# Patient Record
Sex: Female | Born: 1940 | Race: White | Hispanic: No | Smoking: Never smoker
Health system: Southern US, Community
[De-identification: ages and names within clinical notes are randomized; demographics above are authoritative.]

## PROBLEM LIST (undated history)

## (undated) DIAGNOSIS — E782 Mixed hyperlipidemia: Secondary | ICD-10-CM

## (undated) DIAGNOSIS — E119 Type 2 diabetes mellitus without complications: Secondary | ICD-10-CM

## (undated) DIAGNOSIS — J45909 Unspecified asthma, uncomplicated: Secondary | ICD-10-CM

## (undated) DIAGNOSIS — I6529 Occlusion and stenosis of unspecified carotid artery: Secondary | ICD-10-CM

## (undated) DIAGNOSIS — I2510 Atherosclerotic heart disease of native coronary artery without angina pectoris: Secondary | ICD-10-CM

## (undated) DIAGNOSIS — Z9119 Patient's noncompliance with other medical treatment and regimen: Secondary | ICD-10-CM

## (undated) DIAGNOSIS — Z91199 Patient's noncompliance with other medical treatment and regimen due to unspecified reason: Secondary | ICD-10-CM

## (undated) DIAGNOSIS — I158 Other secondary hypertension: Secondary | ICD-10-CM

## (undated) HISTORY — DX: Unspecified asthma, uncomplicated: J45.909

## (undated) HISTORY — DX: Patient's noncompliance with other medical treatment and regimen due to unspecified reason: Z91.199

## (undated) HISTORY — DX: Mixed hyperlipidemia: E78.2

## (undated) HISTORY — DX: Occlusion and stenosis of unspecified carotid artery: I65.29

## (undated) HISTORY — DX: Other secondary hypertension: I15.8

## (undated) HISTORY — DX: Atherosclerotic heart disease of native coronary artery without angina pectoris: I25.10

## (undated) HISTORY — DX: Type 2 diabetes mellitus without complications: E11.9

## (undated) HISTORY — DX: Patient's noncompliance with other medical treatment and regimen: Z91.19

---

## 2003-03-31 ENCOUNTER — Emergency Department (HOSPITAL_COMMUNITY): Admission: EM | Admit: 2003-03-31 | Discharge: 2003-03-31 | Payer: Self-pay | Admitting: Emergency Medicine

## 2006-08-25 ENCOUNTER — Ambulatory Visit: Payer: Self-pay | Admitting: Cardiology

## 2007-09-15 ENCOUNTER — Ambulatory Visit: Payer: Self-pay | Admitting: Family Medicine

## 2007-09-29 ENCOUNTER — Inpatient Hospital Stay (HOSPITAL_COMMUNITY): Admission: EM | Admit: 2007-09-29 | Discharge: 2007-10-01 | Payer: Self-pay | Admitting: Emergency Medicine

## 2007-09-29 ENCOUNTER — Ambulatory Visit: Payer: Self-pay | Admitting: Cardiology

## 2007-10-19 ENCOUNTER — Ambulatory Visit: Payer: Self-pay

## 2007-10-26 ENCOUNTER — Ambulatory Visit: Payer: Self-pay | Admitting: Internal Medicine

## 2008-03-15 ENCOUNTER — Ambulatory Visit: Payer: Self-pay | Admitting: Internal Medicine

## 2008-03-20 ENCOUNTER — Ambulatory Visit: Payer: Self-pay | Admitting: Vascular Surgery

## 2008-08-31 ENCOUNTER — Inpatient Hospital Stay: Payer: Medicare Other | Admitting: Internal Medicine

## 2008-08-31 ENCOUNTER — Ambulatory Visit: Payer: Self-pay | Admitting: Cardiology

## 2008-09-04 ENCOUNTER — Encounter: Payer: Self-pay | Admitting: Internal Medicine

## 2008-09-06 ENCOUNTER — Encounter: Payer: Self-pay | Admitting: Internal Medicine

## 2008-09-21 ENCOUNTER — Ambulatory Visit: Payer: Self-pay | Admitting: Internal Medicine

## 2008-09-21 DIAGNOSIS — E783 Hyperchylomicronemia: Secondary | ICD-10-CM

## 2008-09-21 DIAGNOSIS — I359 Nonrheumatic aortic valve disorder, unspecified: Secondary | ICD-10-CM | POA: Insufficient documentation

## 2008-09-21 DIAGNOSIS — Z91199 Patient's noncompliance with other medical treatment and regimen due to unspecified reason: Secondary | ICD-10-CM | POA: Insufficient documentation

## 2008-09-21 DIAGNOSIS — I6529 Occlusion and stenosis of unspecified carotid artery: Secondary | ICD-10-CM

## 2008-09-21 DIAGNOSIS — I2510 Atherosclerotic heart disease of native coronary artery without angina pectoris: Secondary | ICD-10-CM

## 2008-09-21 DIAGNOSIS — E785 Hyperlipidemia, unspecified: Secondary | ICD-10-CM

## 2008-09-21 DIAGNOSIS — I10 Essential (primary) hypertension: Secondary | ICD-10-CM | POA: Insufficient documentation

## 2008-09-21 DIAGNOSIS — Z9119 Patient's noncompliance with other medical treatment and regimen: Secondary | ICD-10-CM

## 2008-09-24 ENCOUNTER — Inpatient Hospital Stay: Payer: Medicare Other | Admitting: Vascular Surgery

## 2009-01-20 ENCOUNTER — Emergency Department: Payer: Medicare Other | Admitting: Emergency Medicine

## 2009-06-11 ENCOUNTER — Ambulatory Visit: Payer: Medicare Other | Admitting: Ophthalmology

## 2009-06-12 ENCOUNTER — Ambulatory Visit: Payer: Self-pay | Admitting: Internal Medicine

## 2009-07-02 ENCOUNTER — Ambulatory Visit: Payer: Medicare Other | Admitting: Ophthalmology

## 2009-09-12 ENCOUNTER — Ambulatory Visit: Payer: Medicare Other | Admitting: Internal Medicine

## 2009-09-24 ENCOUNTER — Ambulatory Visit: Payer: Medicare Other | Admitting: Ophthalmology

## 2009-09-26 ENCOUNTER — Ambulatory Visit: Payer: Medicare Other | Admitting: Internal Medicine

## 2009-10-10 ENCOUNTER — Ambulatory Visit: Payer: Medicare Other | Admitting: Internal Medicine

## 2009-10-23 ENCOUNTER — Ambulatory Visit: Payer: Medicare Other | Admitting: Internal Medicine

## 2009-11-14 ENCOUNTER — Ambulatory Visit: Payer: Medicare Other | Admitting: Internal Medicine

## 2010-01-30 ENCOUNTER — Ambulatory Visit: Payer: Medicare Other

## 2010-03-22 ENCOUNTER — Encounter: Payer: Self-pay | Admitting: Endocrinology

## 2010-06-01 ENCOUNTER — Encounter: Payer: Medicare Other | Admitting: Cardiothoracic Surgery

## 2010-06-01 ENCOUNTER — Encounter: Payer: Medicare Other | Admitting: Nurse Practitioner

## 2010-06-08 IMAGING — XA IR VASCULAR PROCEDURE
14 of 19 series · 15 of 24 positions shown · IV contrast (IODINE)
Comparison: none

[Series 1: arch · 3 acquisitions, 1 frame shown]
[im 1/3]
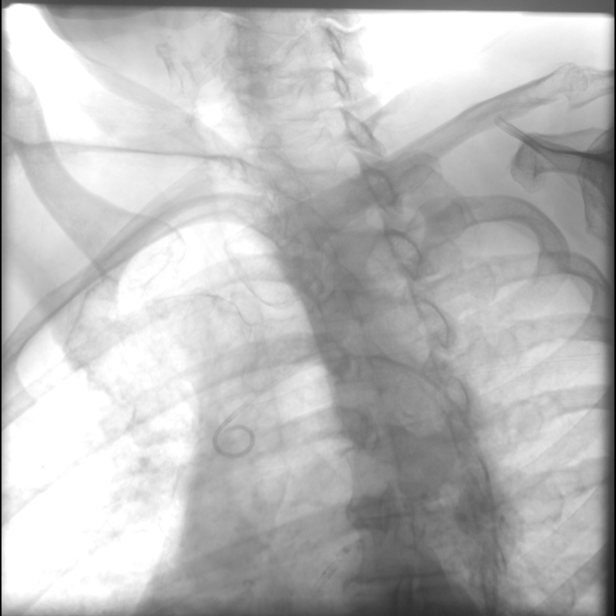

[Series 2: carotid neck · 3 acquisitions, 1 frame shown (1 of 13)]
[im 1/3]
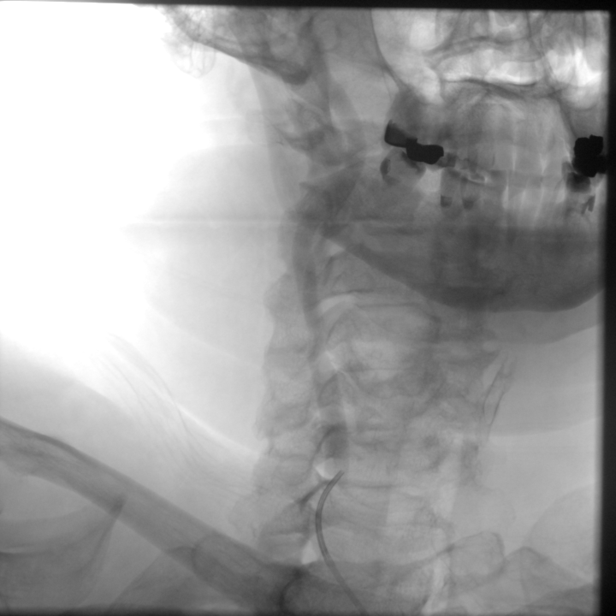

[Series 4: carotid neck · 3 acquisitions, 2 frames shown (2 of 13)]
[im 1/3]
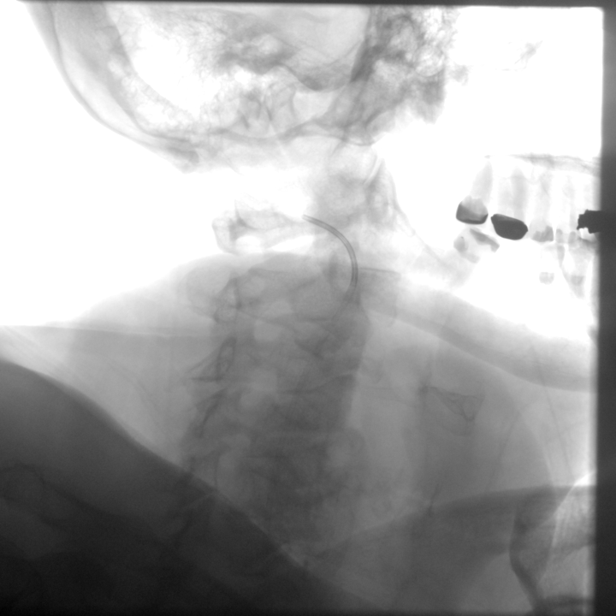
[im 1/3]
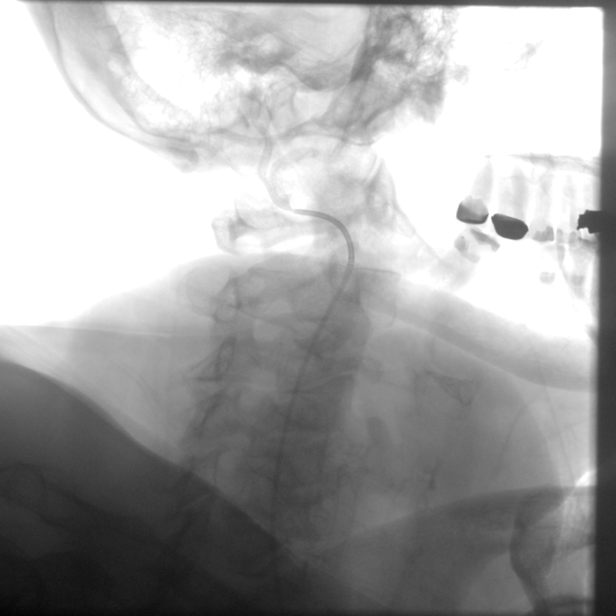

[Series 6: carotid neck · 3 acquisitions, 1 frame shown (3 of 13)]
[im 1/3]
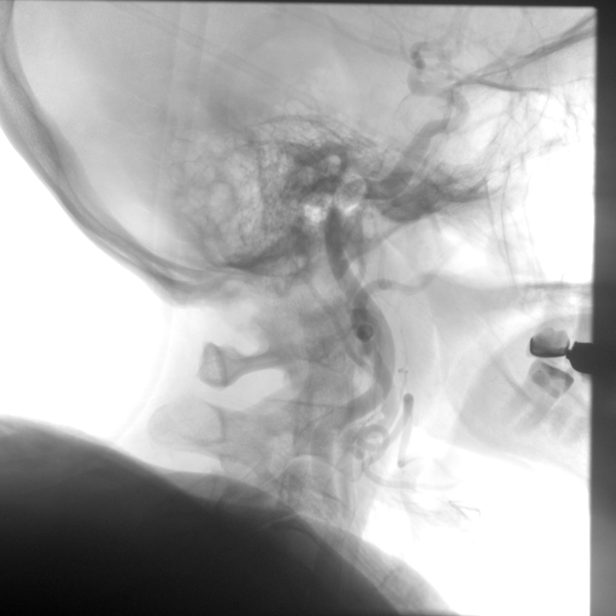

[Series 7: carotid neck · 5 acquisitions, 1 frame shown (4 of 13)]
[im 1/5]
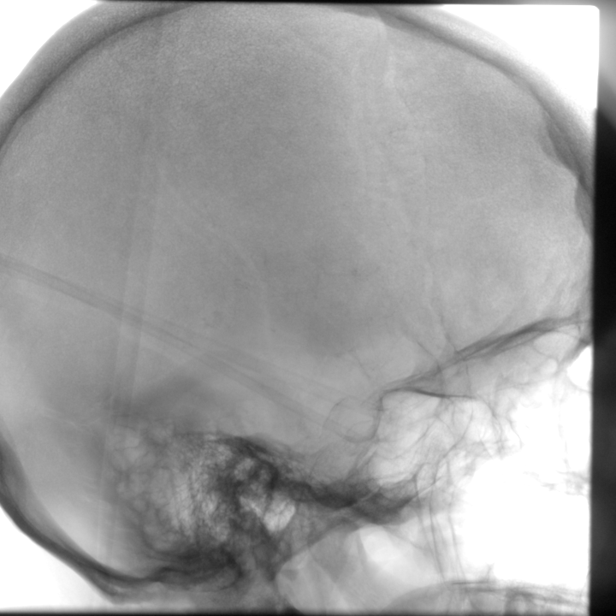

[Series 8: carotid neck · 1 of 5 slices shown (5 of 13)]
[im 5/5]
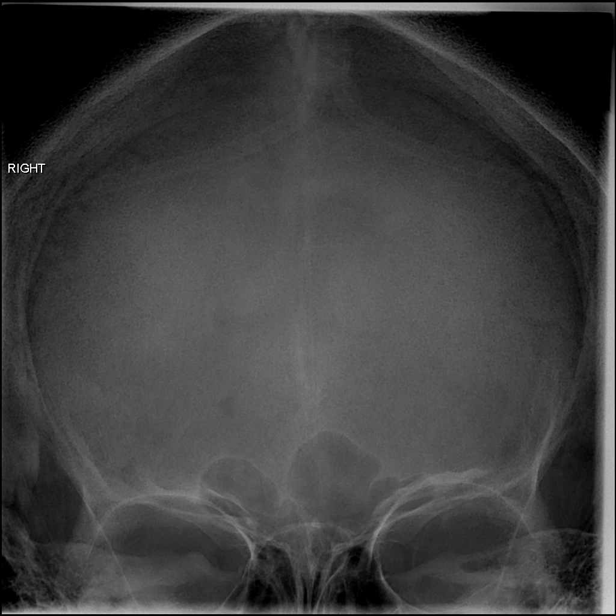

[Series 10: carotid neck · 1 of 27 frames shown (6 of 13)]
[frame 19/27]
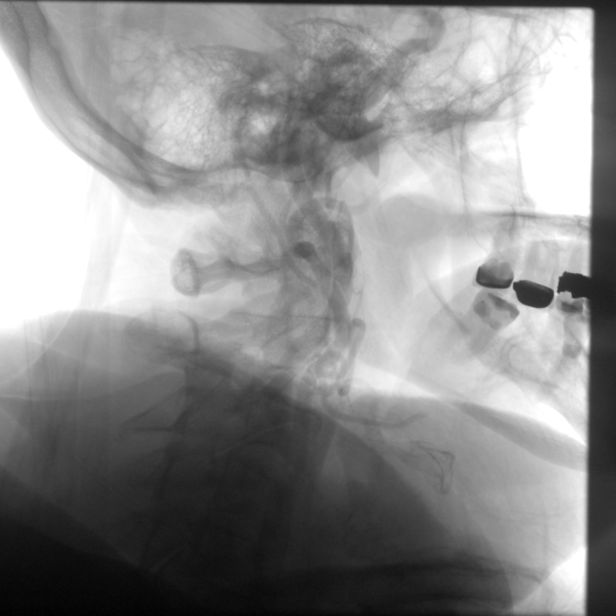

[Series 11: carotid neck · 3 acquisitions, 1 frame shown (7 of 13)]
[im 1/3]
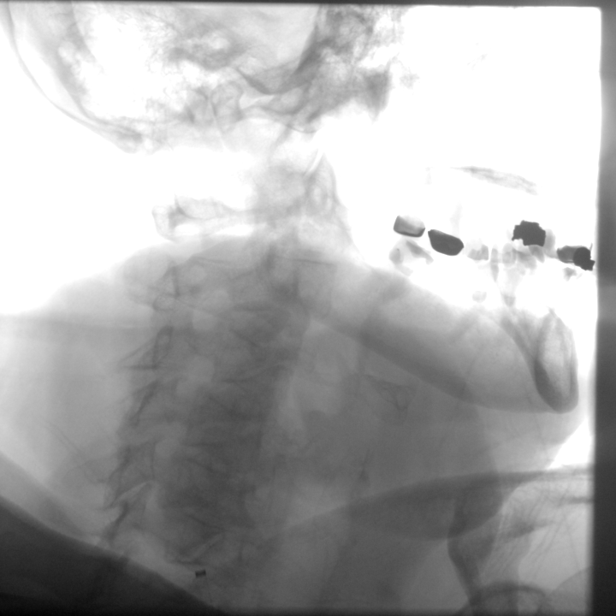

[Series 13: carotid neck · 3 acquisitions, 1 frame shown (8 of 13)]
[im 1/3]
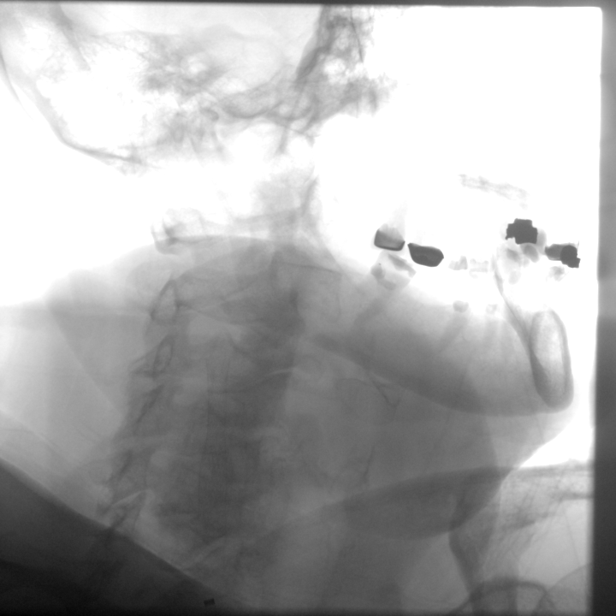

[Series 14: carotid neck · 3 acquisitions, 1 frame shown (9 of 13)]
[im 1/3]
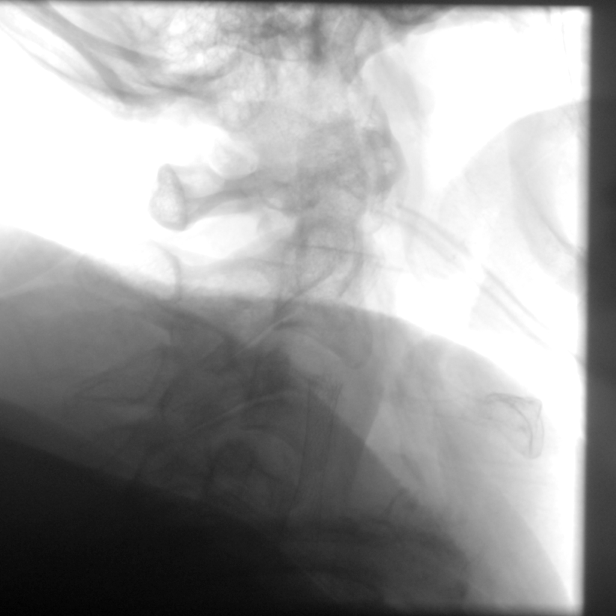

[Series 15: carotid neck · 3 acquisitions, 1 frame shown (10 of 13)]
[im 1/3]
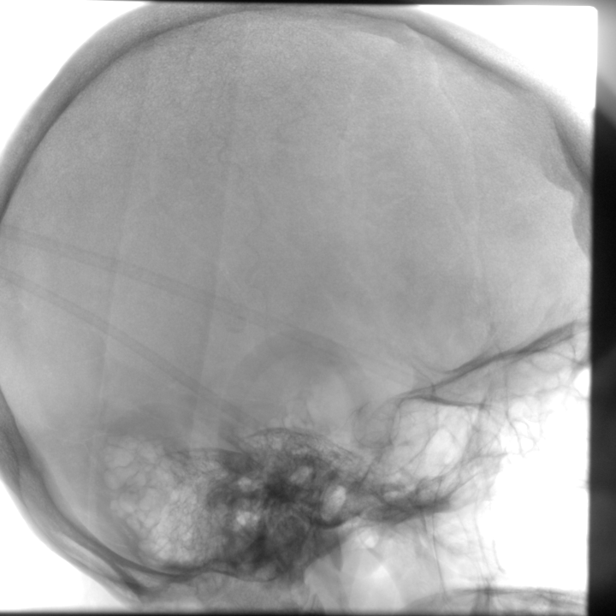

[Series 17: carotid neck · 3 acquisitions, 1 frame shown (11 of 13)]
[im 1/3]
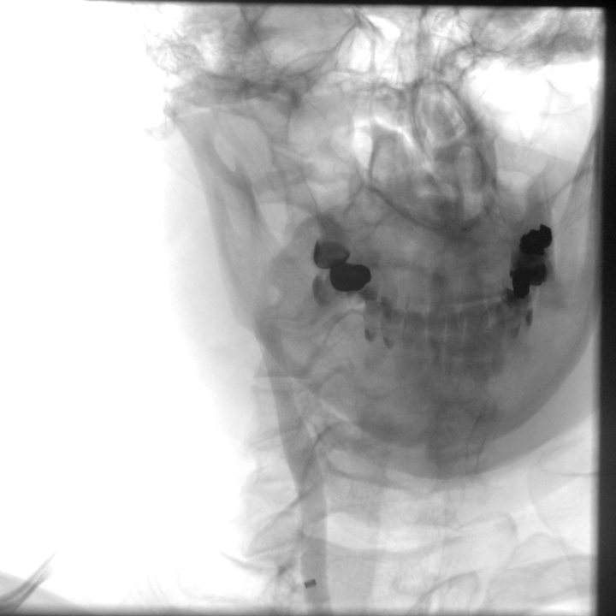

[Series 18: carotid neck · 3 acquisitions, 1 frame shown (12 of 13)]
[im 1/3]
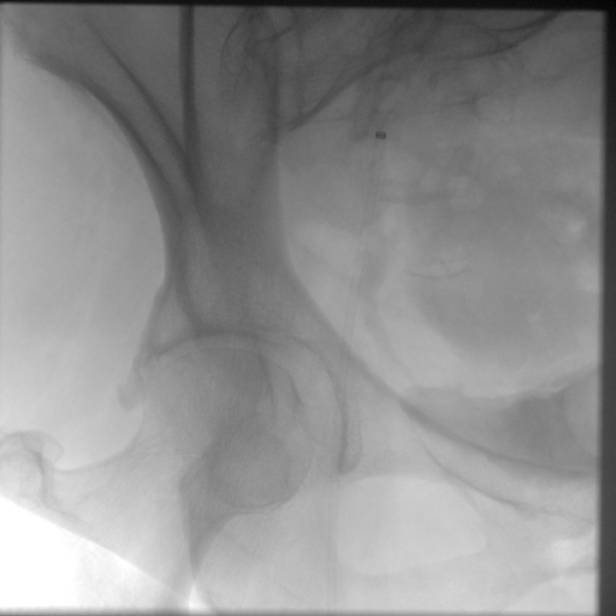

[Series 19: carotid neck · 1 of 29 frames shown (13 of 13)]
[frame 25/29]
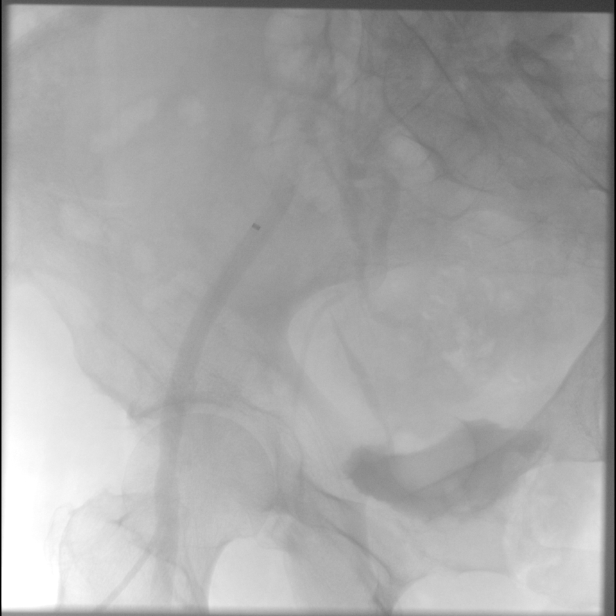

[15 of 24 positions shown; findings below may reference images not displayed]

IMAGES IMPORTED FROM THE SYNGO WORKFLOW SYSTEM
NO DICTATION FOR STUDY

## 2010-07-15 NOTE — Assessment & Plan Note (Signed)
[facility] OFFICE NOTE   Laurie [name], Laurie [name] Laurie [name]                     MRN:          [id number]  DATE:10/26/2007                            DOB:          [birth year]    INTERVAL HISTORY:  Ms. Laurie [name] is a very pleasant 70 year old woman  with a history of diabetes, hyperlipidemia, hypertension, carotid artery  disease, and coronary artery disease.  She is status post a remote LAD  stent in [facility].  She was recently admitted with a non-ST-elevation  myocardial infarction.  She had a Taxus drug-eluting stent to the  diagonal and a Promus drug-eluting stent to the LAD.  She did well.  LV  function was normal.  She returns today for followup.   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  She has not had any focal  neurologic symptoms.  She says her chest pain is resolved.  Her  breathing is better.  She still feels a little bit weak on her legs and  walks with her cane and a walker.  She is about to go to cardiac rehab.   MEDICATIONS:  1. Synthroid 150 a day.  2. Plavix 75 a day.  3. Atenolol 100 a day.  4. Humalog 25 units t.i.d.  5. Lantus 55 units nightly.  6. Imdur 30.  7. Vitamin C.  8. Excedrin Migraine, which has aspirin in it.  9. Lipitor 10.  10.Accupril, which she is out of.   DRUG INTOLERANCES:  MULTIPLE STATINS with rhabdomyolysis.  She is unable  to tolerate higher doses.   PHYSICAL EXAMINATION:  GENERAL:  She is in no acute distress.  Ambulates  around the clinic slowly with her cane.  No respiratory distress.  VITAL SIGNS:  Blood pressure is 124/58, heart rate 76, and weight is  251.  HEENT:  Normal except for the scar from removal of a recent skin lesion  on her forehead.  NECK:  Supple.  No JVD.  Carotids are 1+ bilaterally with bilateral  bruits.   There is no lymphadenopathy or thyromegaly.  CARDIAC:  Regular rate and rhythm.  No murmurs, rubs, or gallops.  LUNGS:  Clear.  ABDOMEN:  Obese, nontender, and nondistended.  No hepatosplenomegaly.  No bruits.  No masses.  Good bowel sounds.  EXTREMITIES:  Warm with no cyanosis or clubbing.  Trace lower extremity  edema.  NEURO:  Alert and oriented x3.  Cranial nerves II through XII are  intact.  Moves all four extremities without difficulty.  Affect is very  pleasant.   ASSESSMENT/PLAN:  1. Coronary artery disease.  She is status post two-vessel      intervention.  She is doing well.  I have recommended cardiac      rehab.  She plans on going.  2. Hypertension, well controlled.  3. Hyperlipidemia.  She can only tolerate low-dose statin.  We will      keep her on  this.  We will recheck her CMET and lipids at next      visit.  Consider possible Zetia.  4. Carotid artery disease.  We will have to refer her to Dr. Liliane Bade for a possible carotid endarterectomy or stenting.   DISPOSITION:  We will see her back in several months for a routine  followup.     [name], MD  Electronically Signed    DRB/MedQ  DD: 10/26/2007  DT: 10/27/2007  Job #: [id number]

## 2010-07-15 NOTE — Cardiovascular Report (Signed)
Laurie Laurie [name], Laurie [name] NO.:  [id number]   MEDICAL RECORD NO.:  [id number]          PATIENT TYPE:  INP   LOCATION:  2916                         FACILITY:  [facility]   PHYSICIAN:  Marca Ancona, MD      DATE OF BIRTH:  04-May-1940   DATE OF PROCEDURE:  DATE OF DISCHARGE:                            CARDIAC CATHETERIZATION   This case was done with Dr. Charlies Constable as the proctor.   PROCEDURE:  Left heart catheterization and left ventriculography.   INDICATION:  Non-ST elevation MI.   PROCEDURE:  After informed consent was obtained, the patient was  sterilely prepped and draped.  The right groin site was locally  anesthetized with 1% lidocaine.  The right common femoral artery was  accessed using Seldinger technique, and a 5-French arterial sheath was  placed.  The left coronary artery was engaged with the 5-French JL4  catheter.  The right coronary artery was engaged with the 5-French JR4  catheter, and the ventricle was entered with the 5-French angled pigtail  catheter.  There were no complications.   FINDINGS:  Left ventriculography:  Overall ejection fraction was 45%.  The mid-to-apical anterolateral wall was severely hypokinetic.  LVEDP  was 14.   Coronary angiography:  The coronary system was right dominant.  The RCA  showed only mild luminal irregularities.  The left main showed no  significant disease.  The LAD had a patent mid LAD stent.  Just distal  to the stent, there was a 60-70% mid LAD stenosis.  Additionally, there  was a large first diagonal vessel with a 99% stenosis.  A small ramus  was totally occluded, and in the circumflex system, there was 50%  stenosis of the ostial first obtuse marginal.   ASSESSMENT:  Tight 99% stenosis in the large first diagonal, 60-70% mid  left anterior descending stenosis distal to the prior placed stent, and  totally occluded ramus.  This was actually a relatively small vessel.   PLAN:  PCI to the first diagonal  and reassessment of the LAD lesion with  possible PCI of the LAD lesion distal to the stent as well.   cc Charlies Constable, MD      Marca Ancona, MD  Electronically Signed     DM/MEDQ  D:  09/29/2007  T:  09/30/2007  Job:  [id number]

## 2010-07-15 NOTE — H&P (Signed)
NAMELACEY, WALLMAN NO.:  [id number]   MEDICAL RECORD NO.:  [id number]         PATIENT TYPE:  CINP   LOCATION:                               FACILITY:  [facility]   PHYSICIAN:  Rollene Rotunda, MD, FACCDATE OF BIRTH:  Jul 29, 1940   DATE OF ADMISSION:  09/29/2007  DATE OF DISCHARGE:                              HISTORY & PHYSICAL   PRIMARY:  Dr. Maryruth Hancock.   REASON FOR PRESENTATION:  The patient with chest pain.   HISTORY OF PRESENT ILLNESS:  This is the second office visit for this  patient.  I first met her in June 2005.  At that time she had chest  discomfort.  She had previous coronary disease as described below.  Because of her symptoms and history, it was suggested and set up for her  to have a cardiac catheterization; however, she never proceeded with  this.  She has a history of medical noncompliance.  I have not seen her  since that appointment as we were unable to contact her or get her to  comply with followup.  Said she has had chest pain off and on since that  time.  However, Sunday of this week she had severe episode while taking  out the garbage.  She said she had chest heaviness, some a 10/10  radiated to her jaw.  There was nausea and vomiting.  She was  diaphoretic and short of breath.  She has had chest discomfort with  minimal exertion since then.  She took 3 sublingual nitroglycerins on  Sunday before the pain finally went away.  She has been taking 1-2  nitroglycerins every day since then.  She did not go to the emergency  room.  She finally presented to the [facility].  It was  suggested that she go to the emergency room but she refused.  She did  comply with coming here today.  She was not describing PND or orthopnea.  She has not described palpitation, presyncope or syncope.  She says she  has been taking a blood pressure medicines and controlling her  cholesterol and taking her blood pressure medicines in controlling her  diabetes.   PAST MEDICAL HISTORY:  1. Coronary artery disease (The patient had a past history of an      abnormal stress test in 2004.  She was found to have a high-grade      LAD lesion and subsequent Taxus stenting.)  2. She has had diabetes mellitus since 1992.  3. Diabetic nephropathy (creatinine is only 0.99 with a GFR of 56).  4. Hypertriglyceridemia.  5. Hypertension.  6. Carotid artery stenosis (I do not know the details).  7. Hypothyroidism.  8. Depression.  9. Umbilical hernia.  10.Bladder prolapse.   PAST SURGICAL HISTORY:  1. C-section.  2. Bilateral tubal ligation.  3. Cholecystectomy.   ALLERGIES:  None.   MEDICATIONS:  1. Levothyroxine 150 mcg daily.  2. Plavix 75 mg daily.  3. Atenolol mg daily.  4. Humalog 25 units t.i.d.  5. Lantus 55 units nightly.  6. Isosorbide 30 mg daily.  7.  Vitamin C.  8. Multivitamin.  9. Excedrin p.r.n.   SOCIAL HISTORY:  The patient lives with her daughter.  She has some  grandchildren.  She has 7 children.  She never smoked cigarettes.  She  does not drink alcohol.   FAMILY HISTORY:  Is contributory for a sister with early onset coronary  disease died at 37.   REVIEW OF SYSTEMS:  As stated in HPI and otherwise negative for other  systems.   PHYSICAL EXAMINATION:  The patient is in no acute distress though she is  burping quite a bit.  She is not actively having chest pain.  Blood pressure 128/68.  Heart rate 75 and regular.  Weight 253 pounds.  HEENT:  Eyes unremarkable, pupils equal, round and reactive to light.  Fundi not visualized.  Oral mucosa unremarkable.  Neck:  No jugular distention, 45 degrees and carotid upstroke brisk and  symmetric.  Left carotid bruit, no thyromegaly.  LYMPHATICS:  No  cervical, axillary, inguinal adenopathy.  LUNGS:  Clear to auscultation without wheezing or crackles.  BACK:  No costovertebral mass.  CHEST:  Unremarkable.  HEART:  PMI not displaced or sustained, S1, S2 within normal.   There is  no S3, S4.  No clicks, rubs, no murmurs.  ABDOMEN:  Morbidly obese.  Positive bowel sounds, normal in frequency and pitch.  No bruits,  rebound, guarding, no midline pulsatile mass.  No hepatomegaly.  Slight  mild voluntary midline tenderness.  SKIN:  No rashes, no nodules.  EXTREMITIES:  Two plus upper pulses, two plus femorals.  Abs, dorsalis  pedis and post tibialis, no edema, cyanosis or clubbing.  NEURO:  Oriented to person, place and time.  Cranial nerves grossly  intact, motor grossly intact.   EKG:  Sinus rhythm, left axis deviation, left ventricle hypertrophy,  interventricular conduction delay, QT slightly prolonged, nonspecific  lateral T wave changes.   ASSESSMENT/PLAN:  1. Chest pain.  The patient is having pain consistent with unstable      angina.  She has known coronary disease.  At this point she needs      cardiac catheterization.  I described to her the risks and benefits      including stroke, death, heart attack, dye allergy, renal      insufficiency, vascular trauma, bleeding, bruising and infection.      She understands and agrees to proceed.  I am going to have her sent      by ambulance to [facility] as I think her symptoms are that acute,      though she is not having an ST-segment elevation myocardial      infarction at this point.  She will have catheterization today      pending labs.  2. Hypertriglyceridemia.  We will check this fasting in the morning      and treat as needed.  3. Diabetes.  She will need her medications excluding metformin which      was discontinued some time recently.      She will need sliding scale insulin.  4. Hypothyroidism.  Check TSH.  Continue levothyroxine.  5. Obesity.  We will discuss this and educate.      Rollene Rotunda, MD, [facility]  Electronically Signed     JH/MEDQ  D:  09/29/2007  T:  09/29/2007  Job:  [id number]

## 2010-07-15 NOTE — Cardiovascular Report (Signed)
Laurie [name], Laurie [name]              ACCOUNT NO.:  [id number]   MEDICAL RECORD NO.:  [id number]          PATIENT TYPE:  INP   LOCATION:  2916                         FACILITY:  [facility]   PHYSICIAN:  Everardo Beals. Juanda Chance, MD, FACCDATE OF BIRTH:  [birth year]   DATE OF PROCEDURE:  09/29/2007  DATE OF DISCHARGE:                            CARDIAC CATHETERIZATION   CLINICAL HISTORY:  Ms. Mcarthy (cox) is 70 years old and has had a  previous stent placed in the mid LAD in [place] several years ago.  She had been seen by Dr. Antoine Poche a year ago for a cardiology followup.  Recently, she developed symptoms of chest pain and came to the emergency  room with more prolonged chest pain and was brought to the cath lab for  evaluation.  The diagnostic cath was performed by Dr. Shirlee Latch with myself  proctoring.  This showed a tight lesion in the diagonal branch of the  LAD and a moderately tight lesion in the mid LAD after the previous  stent.  There was also total occlusion of the ramus branch.  We elected  to proceed on the diagonal and the LAD lesions.   PROCEDURE:  The procedure was performed with a right femoral arterial  sheath and a 6-French Q-3.5 guiding catheter with side holes.  The  patient was given antiemetics and bolus infusion.  She had been on  chronic Plavix and was given additional 300 mg at the end of the  procedure.  She received 4 chewable aspirin.  We passed a P[place] wire  down the diagonal branch across the lesion without difficulty.  We  predilated with a 2.0 x 15-mm apex balloon performing 2 inflations up to  6 atmospheres for 20 seconds.  We then deployed a 2.25 x 16-mm Taxus-  Atom drug-eluting stent with one inflation of 10 atmospheres for 30  seconds.  We postdilated 2.5 x 12-mm [place] Voyager performing 2 inflations  up to 16 atmospheres for 30 seconds.   We then approached the LAD.  We revived the LAD.  We direct stented the  lesion which began in the distal edge of the  previous stent and extended  distally, the stent was __________ outside the stent.  We deployed a 2.5  x 15-mm PROMUS stent deploying this with one inflation of 11 atmospheres  for 30 seconds.  We then postdilated with a 2.75 x 12-mm [place] Voyager  performing 2 inflations up to 16 atmospheres for 30 seconds.  Final  diagnostics were then performed through guiding catheter.   ADDENDUM:  The patient developed slow flow down the diagonal branch  after the post dilatations and this was treated with intracoronary  verapamil with restoration of TIMI III flow.   Otherwise, the patient tolerated the procedure and left the laboratory  in satisfactory condition.  The right femoral was closed with Angio-Seal  at the end of the procedure.   RESULTS:  Initially, stenosis in diagonal branch was estimated 95%.  Following stenting, this improved to 0%.   Initially, the lesion in the LAD was 80% and following stenting, this  improved to  0%.   CONCLUSION:  1. Successful percutaneous coronary intervention of the lesion in the      diagonal branch of left anterior descending using a Taxus drug-      eluting stent with improvement in center narrowing from 95% to 0%.  2. Successful percutaneous coronary intervention of the lesion in the      mid left anterior descending using a PROMUS drug-eluting stent with      improvement in center narrowing from 80% to 0%.   DISPOSITION:  The patient returned to post angio for further  observation.  She should remain on Plavix for at least a year and  probably long-term.      Bruce Elvera Lennox Juanda Chance, MD, [facility]  Electronically Signed     BRB/MEDQ  D:  09/29/2007  T:  09/30/2007  Job:  [id number]   cc:   Rollene Rotunda, MD, [facility]  Dr. Allena Katz, [facility]

## 2010-07-15 NOTE — H&P (Signed)
HISTORY AND PHYSICAL EXAMINATION   March 20, 2008   Re:  Laurie, [name]              DOB:  03-06-1940   CHIEF COMPLAINT:  Severe right internal carotid stenosis with left  internal carotid occlusion - asymptomatic.   HISTORY OF PRESENT ILLNESS:  This is a 70 year old female patient with a  history of coronary artery disease found to have carotid occlusive  disease which has progressed over the years.  Her most recent study at  [facility] vascular lab on March 20, 2008, revealed occlusion of her left  internal carotid artery with a 90-95% right internal carotid stenosis,  antegrade flow in both vertebral arteries.  She has no history of any  hemiparesis, aphasia, amaurosis fugax, diplopia, blurred vision, syncope  or stroke.  She has apparently had appointments in the past which she  has not kept and people have discussed with her possible carotid  stenting but she has never had this scheduled.   PAST MEDICAL HISTORY:  1. Coronary artery disease with previous stenting of her LAD at [facility] many years ago.  She had a non-ST myocardial infarction in      July of 2009 and had a drug-eluting stent to her diagonal and a      drug-eluting stent to her LAD at that time with normal LV function      and has been stable cardiac wise since then.  2. Diabetes, insulin dependent.  3. Hypertension.  4. Hyperlipidemia.  5. Peripheral neuropathy.  6. History of cataracts right eye.   PAST SURGICAL HISTORY:  1. Cesarean section.  2. Cholecystectomy.   FAMILY HISTORY:  Strong for coronary artery disease in her mother,  sister and grandmother, negative for diabetes and stroke.   SOCIAL HISTORY:  She is single and retired.  Does not use tobacco or  alcohol.   REVIEW OF SYSTEMS:  Does have dyspnea on exertion chronically.  No chest  pain.  Apparently does have a stressful relationship with her daughter.  Has pain in her legs with walking and walks with a cane.   ALLERGIES:  To statins.   MEDICATIONS:  1. Synthroid 150 a day.  2. Plavix 75 mg a day.  3. Atenolol 100 mg a day.  4. Humalog insulin 35 units t.i.d.  5. Lantus 50 units at night.  6. Imdur 30 a day.  7. Vitamin C daily.  8. Multivitamin daily.  9. Aspirin 81 mg daily.  10.Question about whether Lisinopril 2.5 mg a day and clonazepam 0.5      mg b.i.d.   PHYSICAL EXAMINATION:  Vital signs:  Blood pressure is 150/80, heart  rate is 70, respirations 14.  General:  She is an obese female in no  apparent distress.  Alert and oriented x3.  Neck:  Supple.  3+ carotid  pulses palpable.  There is harsh bruit on the right.  No bruit on the  left.  Neurologic:  Normal.  No palpable adenopathy in the neck.  Chest:  Clear to auscultation.  Cardiovascular:  Regular rhythm.  No murmurs.  Abdomen:  Obese.  No palpable masses.  She does have an umbilical  hernia.  She has palpable popliteal and distal pulses bilaterally.   IMPRESSION:  1. Severe carotid occlusive disease, left internal carotid occlusion,      right internal carotid stenosis exceeding 90% - asymptomatic.  2. Coronary artery disease stable currently, previous PTCA      (  percutaneous transluminal coronary angioplasty) and stenting in      July 2009 with good LV function.  3. Insulin dependent diabetes mellitus.   PLAN:  Is to admit the patient on March 3 for an elective right carotid  endarterectomy.  I strongly encouraged the patient to have the surgery  prior to that time in late January but she wanted to wait until March.  If she has any symptoms she will be in touch with [place].  The risks and  benefits have been thoroughly discussed with her and her sister.   Laurie [name], M.D.  Electronically Signed   JDL/MEDQ  D:  03/20/2008  T:  03/21/2008  Job:  [id number]   cc:   [name], MD  Dr Tyrone Sage

## 2010-07-15 NOTE — Assessment & Plan Note (Signed)
[facility]                            CARDIOLOGY OFFICE NOTE   WILENE, PHARO                     MRN:          [id number]  DATE:08/25/2006                            DOB:          Apr 28, 1940    REASON FOR PRESENTATION:  Evaluate patient with coronary disease and  chest pain.   HISTORY OF PRESENT ILLNESS:  Patient is a pleasant 70 year old white  female with a past history of chest discomfort and an apparent abnormal  stress test in 2004.  At that time she was seen at [facility] and noted to have  a high-grade LAD lesion, and had subsequent Taxus stenting.  I do not  have the description of this.  She says she really did not have much in  the way of symptoms at that time.  She has also been noted to have an  occluded left internal carotid artery and questionable high-grade lesion  on the right.  This has been followed very closely.  She has been told  over time that she just needs medical management of this.   In December she started having more chest discomfort.  She would get  this with any emotional stress and with activity such as climbing up the  incline in her house.  She would also have it with food.  She saw her  primary care doctor in January, and was given a sublingual  nitroglycerine.  She was very impressed by how effectively this medicine  would take care of her pain.  She was referred to [facility], but did not see  them until May.  At that time, she was started on isosorbide 30 mg, and  had improvement in symptoms.  She was to have a cardiac catheterization.  They also wanted to investigate her carotid stenosis more clearly with  an MRA.  The patient apparently did not proceed with both procedures.  She wanted another opinion, so referred here.   She says she is still getting this discomfort, though it is improved on  the isosorbide.  She has take about 5 nitroglycerin in the last month.  She takes the nitroglycerin if she has the  discomfort at rest  unprovoked.  One nitroglycerin clears it up.  She does not take it if  she gets the discomfort with activity.  She simply stops what she is  doing.  She says it will go away after several minutes.  She describes a  substernal heavy discomfort.  She will have some radiation down her arms  at times.  It can be severe.  She can have associated shortness of  breath.  She is not having any PND or orthopnea.  She is not having any  palpitations, presyncope or syncope.   PAST MEDICAL HISTORY:  1. Diabetes since 1992.  2. Diabetic nephropathy (I am not sure of her creatinine.  The patient      thinks it is normal).  3. Diabetic retinopathy.  4. Hypertriglyceridemia.  5. Hypertension x6 months.  6. Carotid artery disease.  7. Hypothyroidism.  8. Depression.  9. Umbilical hernia.  10.Bladder prolapse.  11.Coronary disease as described.   PAST SURGICAL HISTORY:  1. C-section.  2. Bilateral tubal ligation.  3. Cholecystectomy.   ALLERGIES:  NONE.   MEDICATIONS:  1. Levothyroxine 150 mcg daily.  2. Plavix 75 mg daily.  3. Metformin 500 mg b.i.d.  4. Atenolol 1000 mg daily.  5. Humalog 25 units t.i.d.  6. Lantus 55 units nightly.  7. Isosorbide 30 mg daily.  8. Excedrin Migraine.  9. Vitamin C.  10.Multivitamin.   SOCIAL HISTORY:  The patient lives with her daughter and some  grandchildren.  She has 7 children.  She has never smoked cigarettes,  and does not drink alcohol.   FAMILY HISTORY:  Is contributory for a sister with early onset heart  disease dying at age 48.   REVIEW OF SYSTEMS:  As stated in the HPI, and positive limited mobility  because of pain in her feet, chronic lower extremity swelling, fatigue.  Negative for other systems.   PHYSICAL EXAMINATION:  The patient is in no acute distress.  Blood pressure 178/88.  Heart rate 80 and regular.  Weight 267 pounds.  HEENT:  Eyes unremarkable.  Pupils equal, round, and reactive to light.  Fundi not  visualized.  Oral mucosa unremarkable.  NECK:  No jugular venous distention at 45 degrees.  Carotid upstroke  brisk and symmetric.  Bilateral carotid bruits.  No thyromegaly.  LYMPHATICS:  No cervical, axillary, or inguinal adenopathy.  LUNGS:  Clear to auscultation bilaterally.  BACK:  No costovertebral angle tenderness.  CHEST:  Unremarkable.  HEART:  PMI not displaced or sustained.  S1 and S2 within normal limits.  No S3.  No S4.  No clicks.  No rubs.  No murmurs.  ABDOMEN:  Morbidly obese.  Midline umbilical hernia.  Positive bowel  sounds.  Normal in frequency and pitch.  No bruits.  No rebound.  No  guarding.  No midline pulsatile mass.  No hepatomegaly.  No  splenomegaly.  SKIN:  No rashes.  No nodules.  EXTREMITIES:  Two plus pulses throughout.  Mild bilateral lower  extremity edema.  Mild chronic venostasis changes.  NEUROLOGIC:  Oriented to person, place, and time.  Cranial nerves 2  through 12 grossly intact.  Motor grossly intact.  EKG:  Sinus rhythm.  Rate 80.  Left axis deviation.  Left anterior  fascicular block.  Poor anterior R wave progression.  No acute ST-T wave  changes.  QT prolonged.   ASSESSMENT AND PLAN:  1. Chest pain.  The patient's chest pain is consistent with unstable      angina.  She has known coronary disease and significant ongoing      risk factors.  At this point, she does take the sublingual      nitroglycerin, which helps.  I am going to increase her Imdur to 60      mg.  She knows to call 911 should she have any increase in symptoms      or severe sustained episodes, or if she is otherwise concerned.      She has such a high pretest probability of obstructive coronary      disease as the etiology of her complaints, that the next step      should be a cardiac catheterization.  I discussed this procedure in      detail.  She will have labs to make sure she has no elevated      creatinine or other contraindication.  She understands the risks  and benefits, and agrees to proceed.  She prefers to have this done      at [facility].  2. Carotid stenosis.  I would like to obtain old records before doing      any other studies.  She will try to get these from [facility].  3. Hypertension.  Her blood pressure is elevated.  She will need      titration of her medications.  I will first go up on the Imdur, and      then consider adding perhaps a higher dose of Atenolol or other      therapy.  4. Dyslipidemia.  The patient has not tolerated statins in the past.      However, I am going to add pravastatin, perhaps combination      therapy.  5. Diabetes.  She reports that her hemoglobin A1c was probably greater      than 8.  This is followed closely by her primary care doctor.  6. Followup.  I will see her back at the time of her catheterization.     Rollene Rotunda, MD, [facility]  Electronically Signed    JH/MedQ  DD: 08/25/2006  DT: 08/25/2006  Job #: [id number]   cc:   Maryruth Hancock, M.D.

## 2010-07-15 NOTE — Discharge Summary (Signed)
NAMEAUBRIEGH, [name] NO.:  [id number]   MEDICAL RECORD NO.:  [id number]          PATIENT TYPE:  INP   LOCATION:  2041                         FACILITY:  [facility]   PHYSICIAN:  Laurie Ancona, MD      DATE OF BIRTH:  01/04/1941   DATE OF ADMISSION:  09/29/2007  DATE OF DISCHARGE:  10/01/2007                               DISCHARGE SUMMARY   PRIMARY CARDIOLOGIST:  Laurie Rotunda, MD, [facility], in [place].   PRIMARY CARE Laurie [name]:  Dr. Tyrone [name].   DISCHARGE DIAGNOSIS:  Non-ST-segment elevation myocardial infarction.   SECONDARY DIAGNOSES:  1. Coronary artery disease, status post previous left anterior      descending stenting in 2004 with stenting of the left anterior      descending and diagonal this admission.  2. Hypertension.  3. Hyperlipidemia.  4. Type 2 diabetes mellitus since 1992.  5. Diabetic nephropathy with baseline creatinine 0.99.  6. Hypertriglyceridemia.  7. Peripheral vascular disease/carotid artery stenosis with bilateral      carotid bruits.  8. Hypothyroidism.  9. Depression.  10.Obesity.  11.Umbilical hernia.  12.History of bladder prolapse.  13.Status post cesarean section.  14.Status post bilateral tubal ligation.  15.Status post cholecystectomy.   ALLERGIES:  No known drug allergies.   PROCEDURES:  Left heart cardiac catheterization with successful PCI and  stenting of the proximal first diagonal with placement of a 2.5 x16 mm  Taxus Liberte Atom drug-eluting stent, PCI and stenting of the mid LAD  with placement of a 2.5 x15 mm Promus drug-eluting stent.   HISTORY OF PRESENT ILLNESS:  A 70 year old Caucasian female with prior  history of CAD as well as peripheral vascular disease, previously  followed at [facility].  Over the past year, she has been having intermittent  exertional substernal chest discomfort associated with dyspnea, and  symptoms escalated July 26 while taking out the garbage, and she had  10/10 substernal chest  discomfort with radiation to the jaw.  There was  associated diaphoresis, nausea, vomiting and dyspnea.  She took 3  sublingual nitroglycerin tablets with subsequent relief.  She presented  to the [facility], and it was recommended she go to the  emergency room, but she did not.  She then saw Dr. Antoine [name] July 30th  and was admitted directly from the office for evaluation.   HOSPITAL COURSE:  The patient ruled in for a non-ST-segment elevation MI  with a CK of 206, MB of 12.3, and troponin I of 0.87.  She underwent  left heart cardiac catheterization on July 30th revealing significant  stenoses in both the mid left anterior descending artery as well as the  first diagonal.  Both areas were treated with drug-eluting stents as  outlined in the procedures above.  She tolerated this procedure well.  Post procedure was maintained on aspirin, Plavix, beta blocker therapy.  Low-dose Lipitor was also initiated as the patient has previous  intolerance to all other statins although is willing to try low-dose  Lipitor.  [name] was transferred out to the floor on July 31st and  has been evaluated by  cardiac rehab.  She has been ambulating without  recurrent symptoms or limitations.  She has been counseled on the  importance of caloric restriction with a goal for weight loss and  cardiac rehab.  She has also been counseled on the importance of  compliance.  [name] will be discharged home today in good  condition.   DISCHARGE LABORATORIES:  Hemoglobin 11.3, hematocrit 33.4, WBC 11.5,  platelets 249, MCV 92.5.  Sodium 138, potassium 3.6, chloride 105, CO2  24, BUN 12, creatinine 0.87, glucose 239.  INR 1.0.  CK 206, MB 12.3,  troponin I 0.87.  Calcium 8.3.  BNP 213.   DISPOSITION:  The patient is being discharged home today in good  condition.   FOLLOWUP PLANS AND APPOINTMENTS:  We will arrange followup with Dr.  Antoine [name] in approximately 2 weeks in our [place] office.  As  she has  bilateral carotid bruits without evaluation in greater than a year, we  will obtain a carotid ultrasound as an outpatient.  She does follow up  with Dr. Allena [name] in the next 3 to 4 weeks.   DISCHARGE MEDICATIONS:  1. Aspirin 325 mg daily.  2. Plavix 75 mg daily.  3. Atenolol 100 mg daily.  4. Imdur 30 mg daily.  5. Lipitor 10 mg daily.  6. Synthroid 150 mcg daily.  7. Lantus 55 units q.h.s.  8. NovoLog sliding scale t.i.d. with meals.  9. Nitroglycerin 0.4 mg sublingual p.r.n. chest pain.   OUTSTANDING LABORATORIES/STUDIES:  Will schedule for an ultrasound as an  outpatient.   DURATION OF DISCHARGE ENCOUNTER:  Sixty minutes, including physician  time.      Laurie [name], ANP      Laurie Ancona, MD  Electronically Signed    CB/MEDQ  D:  10/01/2007  T:  10/01/2007  Job:  [id number]   cc:   Dr. Tyrone [name]

## 2010-07-15 NOTE — Assessment & Plan Note (Signed)
[facility] OFFICE NOTE   Laurie [name], Laurie [name]                     MRN:          [id number]  DATE:03/15/2008                            DOB:          Aug 23, 1940    PRIMARY CARE PHYSICIAN:  Dr. Tyrone Sage at [facility].   INTERVAL HISTORY:  Laurie [name] is a 70 year old woman with a history of  obesity, diabetes, hyperlipidemia, hypertension, carotid artery disease,  and coronary artery disease.  She also has significant noncompliance.  She is status post remote LAD stent at [facility].  She experienced a non-ST-  elevation myocardial infarction in July 2009, and had a Taxus drug-  eluting stent to the diagonal and a PROMUS drug-eluting stent to the  LAD.  She did well.  LV function was normal.   I saw her back in August for followup.  She was doing well.  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  I stressed her the importance of following up with Vascular Surgery.  We  made her appointment and had everything set up and she did not show up  for the appointment.  She says that she is under a lot of stress.  Her  son recently tried to commit suicide and she has a very difficult time  with her daughter.  She also had shingles.  She denies any focal  neurologic symptoms.  Regarding her cardiac issues, she says she only  gets chest pain when her daughter is around, otherwise she denies any  chest pain or shortness of breath.  She does walk with a cane.  She has  not had any claudication.   REVIEW OF SYSTEMS:  She denies any orthopnea, no PND, no lower extremity  edema.  She does have hyperlipidemia with marked hypertriglyceridemia.  She saw Dr. Allena Katz several weeks ago who started her on Niaspan.  However, she says she has not started this yet.   CURRENT MEDICATIONS:  1. Synthroid 150 mcg a  day.  2. Plavix 75 a day.  3. Atenolol 100 a day.  4. Humalog insulin 25 units t.i.d.  5. Lantus 50 units at night.  6. Imdur 30 a day.  7. Vitamin C.  8. Multivitamin.  9. Aspirin 81 a day.  10.She is not sure if she is taking her lisinopril 2.5 a day and      clonazepam 0.5 b.i.d.   Drug intolerances are due to MULTIPLE STATINS with rhabdomyolysis.   PHYSICAL EXAMINATION:  GENERAL:  She is in no acute distress.  Ambulates  around the clinic with her cane.  No respiratory distress.  VITAL SIGNS:  Blood pressure is 130/68, heart rate 78, weight is 240.  HEENT:  Normal.  There is a question of a left-sided facial droop.  NECK:  Supple.  No JVD.  Carotids are 1+ bilaterally with bilateral  bruits.  There is no lymphadenopathy or thyromegaly.  CARDIAC:  She had  PMI nondisplaced.  Regular rate and  rhythm with 2/6 systolic ejection  murmur at the right sternal border.  LUNGS:  Clear.  ABDOMEN:  Obese, nontender, nondistended.  No hepatosplenomegaly.  No  bruits.  No masses.  Good bowel sounds.  EXTREMITIES:  Warm with no  cyanosis or clubbing.  There is trace edema.  No rash.  NEURO:  Alert and oriented x3.  Cranial nerves II through XII are  intact.  Moves all 4 extremities without difficulty.  Affect is  pleasant.   ASSESSMENT AND PLAN:  1. Coronary artery disease.  She is status post previous intervention.      She is doing well.  Continue current therapy.  2. Hypertension.  Blood pressure is fairly well controlled.  We will      resume her lisinopril 5 mg a day.  3. Hyperlipidemia.  She is intolerant of statins.  This is being      followed by Dr. Allena Katz.  I did discuss with her Zetia, but she is      not very interested at this point.  I stressed to her the need to      take her Niaspan.  4. Carotid artery disease.  Once again, I stressed to her how critical      an issue this could be and the risk of stroke.  We will once again      refer her to Vascular Surgery for  evaluation in the next week or      so.  She is insisting on trying to reschedule this.     Laurie Buckles. Bensimhon, MD  Electronically Signed    DRB/MedQ  DD: 03/15/2008  DT: 03/16/2008  Job #: [id number]   cc:   Tyrone Sage, MD

## 2010-07-15 NOTE — Procedures (Signed)
CAROTID DUPLEX EXAM   INDICATION:  Follow up carotid artery disease; per [facility] note; R  ICA = 80% to 99% stenosis, L ICA = total occlusion.   HISTORY:  Diabetes:  Yes  Cardiac:  MI, stents  Hypertension:  Yes  Smoking:  No  Previous Surgery:  No  CV History:  No  Amaurosis Fugax No, Paresthesias No, Hemiparesis No                                       RIGHT             LEFT  Brachial systolic pressure:         215               205  Brachial Doppler waveforms:         WNL               WNL  Vertebral direction of flow:        Antegrade         Antegrade  DUPLEX VELOCITIES (cm/sec)  CCA peak systolic                   81                68  ECA peak systolic                   165.              316  ICA peak systolic                   518               Possible occlusion  ICA end diastolic                   195               Possible occlusion  PLAQUE MORPHOLOGY:                  Mixed, calcified  Mixed  PLAQUE AMOUNT:                      Severe            Severe  PLAQUE LOCATION:                    ICA               ICA/ECA   IMPRESSION:  Right ICA shows evidence of 80-99% stenosis.  Left ICA shows evidence of possible occlusion.  Cannot completely rule out string sign.  Left ECA stenosis.     ___________________________________________  Quita Skye Hart [place], M.D.   AS/MEDQ  D:  03/20/2008  T:  03/20/2008  Job:  [id number]

## 2010-08-15 ENCOUNTER — Encounter: Payer: Self-pay | Admitting: Cardiovascular Disease

## 2010-11-24 ENCOUNTER — Ambulatory Visit: Payer: Medicare Other | Admitting: Family Medicine

## 2010-11-28 LAB — POCT CARDIAC MARKERS
CKMB, poc: 19.8
CKMB, poc: 21.7
Myoglobin, poc: 173
Myoglobin, poc: 252
Troponin i, poc: 0.54

## 2010-11-28 LAB — DIFFERENTIAL
Basophils Relative: 1
Lymphs Abs: 2.9
Monocytes Relative: 6
Neutro Abs: 9.5 — ABNORMAL HIGH
Neutrophils Relative %: 69

## 2010-11-28 LAB — CBC
HCT: 38.7
MCHC: 33.8
MCV: 91.8
RBC: 4.22
RDW: 14.6
WBC: 13.8 — ABNORMAL HIGH

## 2010-11-28 LAB — BASIC METABOLIC PANEL
Calcium: 8.3 — ABNORMAL LOW
GFR calc Af Amer: >60
GFR calc non Af Amer: >60
Potassium: 3.6
Sodium: 138

## 2010-11-28 LAB — PROTIME-INR: INR: 1

## 2010-11-28 LAB — POCT I-STAT, CHEM 8
BUN: 18
Calcium, Ion: 1.14
Creatinine, Ser: 1
Potassium: 4.3
Sodium: 139

## 2010-11-28 LAB — APTT: aPTT: 32

## 2011-05-10 ENCOUNTER — Other Ambulatory Visit: Payer: Self-pay | Admitting: Nurse Practitioner

## 2012-05-26 ENCOUNTER — Emergency Department: Payer: Self-pay | Admitting: Emergency Medicine

## 2012-07-09 ENCOUNTER — Other Ambulatory Visit: Payer: Self-pay | Admitting: Nurse Practitioner

## 2013-06-27 ENCOUNTER — Other Ambulatory Visit: Payer: Self-pay | Admitting: Internal Medicine

## 2013-06-28 ENCOUNTER — Other Ambulatory Visit: Payer: Self-pay | Admitting: Internal Medicine

## 2013-06-28 MED ORDER — NITROGLYCERIN 0.4 MG SL SUBL: 0.4000 mg | SUBLINGUAL_TABLET | SUBLINGUAL | Status: AC | PRN

## 2013-06-28 NOTE — Addendum Note (Signed)
Addended by: Noralee SpaceSCHUB, Tahtiana Rozier M on: 06/28/2013 02:45 PM   Modules accepted: Orders

## 2013-09-11 ENCOUNTER — Encounter: Payer: Self-pay | Admitting: Family Medicine

## 2013-09-30 ENCOUNTER — Encounter: Payer: Self-pay | Admitting: Family Medicine

## 2013-11-12 ENCOUNTER — Inpatient Hospital Stay: Payer: Self-pay | Admitting: Internal Medicine

## 2013-11-12 LAB — URINALYSIS, COMPLETE
Bilirubin,UR: NEGATIVE
Blood: NEGATIVE
Glucose,UR: NEGATIVE mg/dL (ref 0–75)
Ketone: NEGATIVE
Nitrite: NEGATIVE
PROTEIN: NEGATIVE
Ph: 6 (ref 4.5–8.0)
Specific Gravity: 1.004 (ref 1.003–1.030)
Squamous Epithelial: 9
WBC UR: 340 /HPF (ref 0–5)

## 2013-11-12 LAB — TROPONIN I
Troponin-I: 0.17 ng/mL — ABNORMAL HIGH
Troponin-I: 0.17 ng/mL — ABNORMAL HIGH
Troponin-I: 0.18 ng/mL — ABNORMAL HIGH

## 2013-11-12 LAB — COMPREHENSIVE METABOLIC PANEL
ALBUMIN: 3.4 g/dL (ref 3.4–5.0)
AST: 19 U/L (ref 15–37)
Alkaline Phosphatase: 89 U/L
Anion Gap: 8 (ref 7–16)
BUN: 20 mg/dL — ABNORMAL HIGH (ref 7–18)
Bilirubin,Total: 0.7 mg/dL (ref 0.2–1.0)
CREATININE: 1.18 mg/dL (ref 0.60–1.30)
Calcium, Total: 9.1 mg/dL (ref 8.5–10.1)
Chloride: 99 mmol/L (ref 98–107)
Co2: 22 mmol/L (ref 21–32)
EGFR (African American): 53 — ABNORMAL LOW
EGFR (Non-African Amer.): 46 — ABNORMAL LOW
GLUCOSE: 120 mg/dL — AB (ref 65–99)
Osmolality: 263 (ref 275–301)
Potassium: 4.1 mmol/L (ref 3.5–5.1)
SGPT (ALT): 24 U/L
Sodium: 129 mmol/L — ABNORMAL LOW (ref 136–145)
Total Protein: 8.2 g/dL (ref 6.4–8.2)

## 2013-11-12 LAB — CK
CK, TOTAL: 199 U/L — AB
CK, Total: 143 U/L

## 2013-11-12 LAB — CK-MB
CK-MB: 4 ng/mL — AB (ref 0.5–3.6)
CK-MB: 6.6 ng/mL — ABNORMAL HIGH (ref 0.5–3.6)
CK-MB: 8.7 ng/mL — ABNORMAL HIGH (ref 0.5–3.6)

## 2013-11-12 LAB — CBC
HCT: 37.6 % (ref 35.0–47.0)
HGB: 12.1 g/dL (ref 12.0–16.0)
MCH: 28.1 pg (ref 26.0–34.0)
MCHC: 32.1 g/dL (ref 32.0–36.0)
MCV: 88 fL (ref 80–100)
PLATELETS: 426 10*3/uL (ref 150–440)
RBC: 4.29 10*6/uL (ref 3.80–5.20)
RDW: 16.5 % — ABNORMAL HIGH (ref 11.5–14.5)
WBC: 16 10*3/uL — ABNORMAL HIGH (ref 3.6–11.0)

## 2013-11-12 LAB — PRO B NATRIURETIC PEPTIDE: B-Type Natriuretic Peptide: 4280 pg/mL — ABNORMAL HIGH (ref 0–125)

## 2013-11-12 LAB — TSH: THYROID STIMULATING HORM: 3.08 u[IU]/mL

## 2013-11-13 LAB — CBC WITH DIFFERENTIAL/PLATELET
BASOS ABS: 0.1 10*3/uL (ref 0.0–0.1)
Basophil %: 1.1 %
Eosinophil #: 0.4 10*3/uL (ref 0.0–0.7)
Eosinophil %: 2.9 %
HCT: 34.4 % — AB (ref 35.0–47.0)
HGB: 10.9 g/dL — ABNORMAL LOW (ref 12.0–16.0)
LYMPHS ABS: 1.8 10*3/uL (ref 1.0–3.6)
LYMPHS PCT: 14.1 %
MCH: 27.6 pg (ref 26.0–34.0)
MCHC: 31.7 g/dL — AB (ref 32.0–36.0)
MCV: 87 fL (ref 80–100)
Monocyte #: 0.8 x10 3/mm (ref 0.2–0.9)
Monocyte %: 6.4 %
Neutrophil #: 9.4 10*3/uL — ABNORMAL HIGH (ref 1.4–6.5)
Neutrophil %: 75.5 %
Platelet: 323 10*3/uL (ref 150–440)
RBC: 3.96 10*6/uL (ref 3.80–5.20)
RDW: 16.6 % — AB (ref 11.5–14.5)
WBC: 12.5 10*3/uL — ABNORMAL HIGH (ref 3.6–11.0)

## 2013-11-13 LAB — BASIC METABOLIC PANEL
ANION GAP: 8 (ref 7–16)
BUN: 22 mg/dL — ABNORMAL HIGH (ref 7–18)
CO2: 27 mmol/L (ref 21–32)
Calcium, Total: 9.2 mg/dL (ref 8.5–10.1)
Chloride: 99 mmol/L (ref 98–107)
Creatinine: 1.27 mg/dL (ref 0.60–1.30)
EGFR (Non-African Amer.): 42 — ABNORMAL LOW
GFR CALC AF AMER: 49 — AB
Glucose: 106 mg/dL — ABNORMAL HIGH (ref 65–99)
Osmolality: 272 (ref 275–301)
POTASSIUM: 3.3 mmol/L — AB (ref 3.5–5.1)
Sodium: 134 mmol/L — ABNORMAL LOW (ref 136–145)

## 2013-11-13 LAB — CK: CK, Total: 186 U/L

## 2013-11-13 LAB — MAGNESIUM: Magnesium: 1.5 mg/dL — ABNORMAL LOW

## 2013-11-14 LAB — CBC WITH DIFFERENTIAL/PLATELET
Basophil #: 0.1 10*3/uL (ref 0.0–0.1)
Basophil %: 0.7 %
Eosinophil #: 0.5 10*3/uL (ref 0.0–0.7)
Eosinophil %: 3.5 %
HCT: 34.5 % — ABNORMAL LOW (ref 35.0–47.0)
HGB: 11.1 g/dL — ABNORMAL LOW (ref 12.0–16.0)
LYMPHS ABS: 1.7 10*3/uL (ref 1.0–3.6)
Lymphocyte %: 12.5 %
MCH: 27.9 pg (ref 26.0–34.0)
MCHC: 32.1 g/dL (ref 32.0–36.0)
MCV: 87 fL (ref 80–100)
Monocyte #: 1.2 x10 3/mm — ABNORMAL HIGH (ref 0.2–0.9)
Monocyte %: 8.6 %
NEUTROS ABS: 10.1 10*3/uL — AB (ref 1.4–6.5)
NEUTROS PCT: 74.7 %
PLATELETS: 338 10*3/uL (ref 150–440)
RBC: 3.97 10*6/uL (ref 3.80–5.20)
RDW: 16.6 % — ABNORMAL HIGH (ref 11.5–14.5)
WBC: 13.6 10*3/uL — AB (ref 3.6–11.0)

## 2013-11-14 LAB — BASIC METABOLIC PANEL
ANION GAP: 9 (ref 7–16)
BUN: 26 mg/dL — ABNORMAL HIGH (ref 7–18)
CREATININE: 1.52 mg/dL — AB (ref 0.60–1.30)
Calcium, Total: 8.7 mg/dL (ref 8.5–10.1)
Chloride: 99 mmol/L (ref 98–107)
Co2: 26 mmol/L (ref 21–32)
EGFR (Non-African Amer.): 34 — ABNORMAL LOW
GFR CALC AF AMER: 39 — AB
Glucose: 125 mg/dL — ABNORMAL HIGH (ref 65–99)
OSMOLALITY: 274 (ref 275–301)
POTASSIUM: 3.7 mmol/L (ref 3.5–5.1)
SODIUM: 134 mmol/L — AB (ref 136–145)

## 2013-11-14 LAB — MAGNESIUM: Magnesium: 2 mg/dL

## 2013-11-15 LAB — CBC WITH DIFFERENTIAL/PLATELET
BASOS ABS: 0.1 10*3/uL (ref 0.0–0.1)
Basophil %: 0.8 %
EOS ABS: 0.4 10*3/uL (ref 0.0–0.7)
EOS PCT: 3.3 %
HCT: 32.9 % — ABNORMAL LOW (ref 35.0–47.0)
HGB: 11 g/dL — ABNORMAL LOW (ref 12.0–16.0)
LYMPHS ABS: 1.5 10*3/uL (ref 1.0–3.6)
LYMPHS PCT: 11.1 %
MCH: 28.6 pg (ref 26.0–34.0)
MCHC: 33.3 g/dL (ref 32.0–36.0)
MCV: 86 fL (ref 80–100)
MONO ABS: 1.2 x10 3/mm — AB (ref 0.2–0.9)
Monocyte %: 9.2 %
NEUTROS ABS: 10.1 10*3/uL — AB (ref 1.4–6.5)
NEUTROS PCT: 75.6 %
Platelet: 326 10*3/uL (ref 150–440)
RBC: 3.83 10*6/uL (ref 3.80–5.20)
RDW: 16.4 % — ABNORMAL HIGH (ref 11.5–14.5)
WBC: 13.4 10*3/uL — ABNORMAL HIGH (ref 3.6–11.0)

## 2013-11-15 LAB — BASIC METABOLIC PANEL
Anion Gap: 9 (ref 7–16)
BUN: 29 mg/dL — ABNORMAL HIGH (ref 7–18)
CALCIUM: 8.5 mg/dL (ref 8.5–10.1)
CHLORIDE: 102 mmol/L (ref 98–107)
CREATININE: 1.35 mg/dL — AB (ref 0.60–1.30)
Co2: 25 mmol/L (ref 21–32)
EGFR (African American): 45 — ABNORMAL LOW
EGFR (Non-African Amer.): 39 — ABNORMAL LOW
Glucose: 125 mg/dL — ABNORMAL HIGH (ref 65–99)
OSMOLALITY: 279 (ref 275–301)
POTASSIUM: 4 mmol/L (ref 3.5–5.1)
Sodium: 136 mmol/L (ref 136–145)

## 2013-11-15 LAB — URINE CULTURE

## 2013-11-16 LAB — CBC WITH DIFFERENTIAL/PLATELET
BASOS ABS: 0.1 10*3/uL (ref 0.0–0.1)
BASOS PCT: 0.9 %
EOS PCT: 3.1 %
Eosinophil #: 0.4 10*3/uL (ref 0.0–0.7)
HCT: 33.1 % — ABNORMAL LOW (ref 35.0–47.0)
HGB: 10.9 g/dL — AB (ref 12.0–16.0)
Lymphocyte #: 1.6 10*3/uL (ref 1.0–3.6)
Lymphocyte %: 11.8 %
MCH: 28.3 pg (ref 26.0–34.0)
MCHC: 32.8 g/dL (ref 32.0–36.0)
MCV: 86 fL (ref 80–100)
Monocyte #: 1.1 x10 3/mm — ABNORMAL HIGH (ref 0.2–0.9)
Monocyte %: 8.1 %
Neutrophil #: 10.1 10*3/uL — ABNORMAL HIGH (ref 1.4–6.5)
Neutrophil %: 76.1 %
Platelet: 317 10*3/uL (ref 150–440)
RBC: 3.83 10*6/uL (ref 3.80–5.20)
RDW: 16.7 % — ABNORMAL HIGH (ref 11.5–14.5)
WBC: 13.3 10*3/uL — ABNORMAL HIGH (ref 3.6–11.0)

## 2013-11-16 LAB — BASIC METABOLIC PANEL
ANION GAP: 7 (ref 7–16)
BUN: 35 mg/dL — AB (ref 7–18)
Calcium, Total: 9 mg/dL (ref 8.5–10.1)
Chloride: 101 mmol/L (ref 98–107)
Co2: 27 mmol/L (ref 21–32)
Creatinine: 1.42 mg/dL — ABNORMAL HIGH (ref 0.60–1.30)
EGFR (African American): 43 — ABNORMAL LOW
GFR CALC NON AF AMER: 37 — AB
Glucose: 147 mg/dL — ABNORMAL HIGH (ref 65–99)
Osmolality: 281 (ref 275–301)
Potassium: 3.8 mmol/L (ref 3.5–5.1)
Sodium: 135 mmol/L — ABNORMAL LOW (ref 136–145)

## 2013-11-16 LAB — MAGNESIUM: MAGNESIUM: 1.7 mg/dL — AB

## 2013-11-17 LAB — CULTURE, BLOOD (SINGLE)

## 2013-12-08 ENCOUNTER — Other Ambulatory Visit: Payer: Self-pay | Admitting: Internal Medicine

## 2014-03-15 ENCOUNTER — Inpatient Hospital Stay: Payer: Self-pay | Admitting: Internal Medicine

## 2014-03-15 LAB — BASIC METABOLIC PANEL
Anion Gap: 13 (ref 7–16)
BUN: 75 mg/dL — AB (ref 7–18)
Calcium, Total: 8.8 mg/dL (ref 8.5–10.1)
Chloride: 94 mmol/L — ABNORMAL LOW (ref 98–107)
Co2: 21 mmol/L (ref 21–32)
Creatinine: 2.59 mg/dL — ABNORMAL HIGH (ref 0.60–1.30)
EGFR (African American): 23 — ABNORMAL LOW
EGFR (Non-African Amer.): 19 — ABNORMAL LOW
Glucose: 146 mg/dL — ABNORMAL HIGH (ref 65–99)
OSMOLALITY: 282 (ref 275–301)
POTASSIUM: 4.8 mmol/L (ref 3.5–5.1)
SODIUM: 128 mmol/L — AB (ref 136–145)

## 2014-03-15 LAB — PROTIME-INR
INR: 1.6
PROTHROMBIN TIME: 18.9 s — AB (ref 11.5–14.7)

## 2014-03-15 LAB — CBC
HCT: 34.2 % — ABNORMAL LOW (ref 35.0–47.0)
HGB: 10.9 g/dL — ABNORMAL LOW (ref 12.0–16.0)
MCH: 26.1 pg (ref 26.0–34.0)
MCHC: 31.8 g/dL — ABNORMAL LOW (ref 32.0–36.0)
MCV: 82 fL (ref 80–100)
PLATELETS: 423 10*3/uL (ref 150–440)
RBC: 4.16 10*6/uL (ref 3.80–5.20)
RDW: 20.5 % — ABNORMAL HIGH (ref 11.5–14.5)
WBC: 12.7 10*3/uL — ABNORMAL HIGH (ref 3.6–11.0)

## 2014-03-15 LAB — APTT: Activated PTT: 32.6 secs (ref 23.6–35.9)

## 2014-03-15 LAB — TROPONIN I: Troponin-I: 0.26 ng/mL — ABNORMAL HIGH

## 2014-03-15 LAB — PRO B NATRIURETIC PEPTIDE: B-Type Natriuretic Peptide: 10528 pg/mL — ABNORMAL HIGH (ref 0–125)

## 2014-03-16 ENCOUNTER — Ambulatory Visit (HOSPITAL_COMMUNITY)
Admission: AD | Admit: 2014-03-16 | Discharge: 2014-03-16 | Disposition: A | Discharge status: Home / Self Care | Payer: Medicare Other | Source: Other Acute Inpatient Hospital | Attending: Internal Medicine | Admitting: Internal Medicine

## 2014-03-16 DIAGNOSIS — I509 Heart failure, unspecified: Secondary | ICD-10-CM | POA: Diagnosis present

## 2014-03-16 DIAGNOSIS — I214 Non-ST elevation (NSTEMI) myocardial infarction: Secondary | ICD-10-CM | POA: Diagnosis not present

## 2014-03-16 LAB — TROPONIN I
TROPONIN-I: 0.35 ng/mL — AB
TROPONIN-I: 1.3 ng/mL — AB

## 2014-03-16 LAB — CBC WITH DIFFERENTIAL/PLATELET
BASOS PCT: 1.4 %
Basophil #: 0.2 10*3/uL — ABNORMAL HIGH (ref 0.0–0.1)
EOS PCT: 1.5 %
Eosinophil #: 0.2 10*3/uL (ref 0.0–0.7)
HCT: 33.1 % — AB (ref 35.0–47.0)
HGB: 10.4 g/dL — ABNORMAL LOW (ref 12.0–16.0)
LYMPHS PCT: 15.7 %
Lymphocyte #: 2 10*3/uL (ref 1.0–3.6)
MCH: 26.4 pg (ref 26.0–34.0)
MCHC: 31.5 g/dL — ABNORMAL LOW (ref 32.0–36.0)
MCV: 84 fL (ref 80–100)
MONOS PCT: 8.6 %
Monocyte #: 1.1 x10 3/mm — ABNORMAL HIGH (ref 0.2–0.9)
NEUTROS ABS: 9.1 10*3/uL — AB (ref 1.4–6.5)
Neutrophil %: 72.8 %
Platelet: 346 10*3/uL (ref 150–440)
RBC: 3.95 10*6/uL (ref 3.80–5.20)
RDW: 20.2 % — ABNORMAL HIGH (ref 11.5–14.5)
WBC: 12.4 10*3/uL — AB (ref 3.6–11.0)

## 2014-03-16 LAB — CK-MB
CK-MB: 2.8 ng/mL (ref 0.5–3.6)
CK-MB: 3.7 ng/mL — AB (ref 0.5–3.6)
CK-MB: 4.7 ng/mL — ABNORMAL HIGH (ref 0.5–3.6)

## 2014-03-16 LAB — COMPREHENSIVE METABOLIC PANEL
ALBUMIN: 2.3 g/dL — AB (ref 3.4–5.0)
ANION GAP: 10 (ref 7–16)
AST: 1440 U/L — AB (ref 15–37)
Alkaline Phosphatase: 154 U/L — ABNORMAL HIGH
BUN: 78 mg/dL — AB (ref 7–18)
Bilirubin,Total: 1.2 mg/dL — ABNORMAL HIGH (ref 0.2–1.0)
CREATININE: 2.52 mg/dL — AB (ref 0.60–1.30)
Calcium, Total: 8.8 mg/dL (ref 8.5–10.1)
Chloride: 96 mmol/L — ABNORMAL LOW (ref 98–107)
Co2: 26 mmol/L (ref 21–32)
EGFR (African American): 24 — ABNORMAL LOW
EGFR (Non-African Amer.): 20 — ABNORMAL LOW
Glucose: 75 mg/dL (ref 65–99)
Osmolality: 287 (ref 275–301)
POTASSIUM: 4.9 mmol/L (ref 3.5–5.1)
SGPT (ALT): 929 U/L — ABNORMAL HIGH
Sodium: 132 mmol/L — ABNORMAL LOW (ref 136–145)
TOTAL PROTEIN: 6.5 g/dL (ref 6.4–8.2)

## 2014-03-16 LAB — HEPARIN LEVEL (UNFRACTIONATED)
ANTI-XA(UNFRACTIONATED): 0.28 [IU]/mL — AB (ref 0.30–0.70)
Anti-Xa(Unfractionated): 0.62 IU/mL (ref 0.30–0.70)

## 2014-03-20 LAB — CULTURE, BLOOD (SINGLE)

## 2014-05-01 DEATH — deceased

## 2014-06-23 NOTE — H&P (Signed)
PATIENT NAME:  Laurie [name], Laurie [name] MR#:  [id number] DATE OF BIRTH:  01-12-1941  DATE OF ADMISSION:  11/12/2013  PRIMARY CARE PROVIDER: Dr. Maryruth Hancock.  REFERRING PHYSICIAN: Dr. Cyril Loosen.   CHIEF COMPLAINT: Shortness of breath, acute respiratory failure.   HISTORY OF PRESENT ILLNESS: The patient is a 74 year old white female with multiple medical problems including diabetes, coronary artery disease, hypothyroidism, bilateral carotid artery stenosis, a history of previous CVA in the past who follows for her cardiology care at [facility], who has been short of breath for the past few days progressively worse. Her shortness of breath since yesterday got so severe that she could not talk. The patient also has been having dry cough and had to sleep in a recliner. She has also noticed significant swelling in her lower extremities. She also has had some chest pressure. She also complains of some abdominal pain in the left lower quadrant. She has not had any fevers or chills. She reports that her last echocardiogram was done in August at [facility].   PAST MEDICAL HISTORY:  1. Significant for coronary artery disease with  stents, last stent in May of this year.  2. Hypertension.  3. Hypertriglyceridemia.  4. Diabetes insulin requiring.  5. Diabetic retinopathy.  6. Diabetic neuropathy.  7. Lumbar spinal stenosis.  8. Migraine headaches.  9. History of bladder prolapse.  10. Depression.  11. Hypothyroidism.  12. History of hyperkalemia.  13. History of shingles.  14. Severe peripheral vascular disease with complete occlusion of the left carotid artery.   PAST SURGICAL HISTORY:  1. Status post right carotid endarterectomy status post coronary stents .  2. Cesarean section, status post cholecystectomy.   ALLERGIES: STATIN CAUSES MYALGIAS.   CURRENT MEDICATIONS: She is on losartan 50 mg 1 tab p.o. daily, daily NovoLog, oxycodone 5 mg 1 tab p.o. daily as needed for severe pain. Lantus 30  units at bedtime, Brilinta 90 mg 1 tab p.o. b.i.d., levothyroxine 125 mcg daily, atenolol 50 daily, aspirin 81 mg 1 tab p.o. daily, multivitamin 1 tab p.o. daily, nitroglycerin 0.4 sublingual p.r.n., Imdur 60 daily, fish oil 1000 mg 1 tab p.o. daily, vitamin D 1000 international units daily, Protonix 40 daily, nystatin powder apply to skin folds as needed.   SOCIAL HISTORY: Does not smoke. Does not drink. No drugs.   FAMILY HISTORY: Positive for mother died of history of heart disease, MI runs on patient's maternal side. A sister who died secondary to MI. Father is deceased. History of diabetes.  REVIEW OF SYSTEMS:  CONSTITUTIONAL: Denies any fevers or fatigue, weakness. Complains of weight loss, but uncertain how much.  EYES: No blurred or double vision. No redness. No inflammation.  ENT: No tinnitus. No ear pain. No hearing loss. No seasonal or year-round allergies. No epistaxis. No discharge. No difficulty with swallowing.  RESPIRATORY: Complains of dry cough. Also complains of some wheezing. No history of COPD, no TB, no pneumonia.  CARDIOVASCULAR: Complains of chest pressure, complains of orthopnea, edema, dyspnea on exertion. No palpitation. No syncope.  GASTROINTESTINAL: Complains of abdominal pain in the left lower quadrant.  GENITOURINARY: Denies any dysuria or hematuria, renal calculus or frequency.  ENDOCRINE: Denies any polyuria, nocturia. Does have hypothyroidism.  HEMATOLOGIC AND LYMPHATIC: Denies anemia, easy bruisability or swollen glands.  SKIN: No acne. No rash.  MUSCULOSKELETAL: Denies any pain in the neck, back or shoulder. No gout.  NEUROLOGIC: Has history of CVA in the past. No seizures.  PSYCHIATRIC: Denies any anxiety, insomnia, or ADD.  PHYSICAL EXAMINATION:  VITAL SIGNS: Temperature 97.9, pulse 75, respirations 27, respirations 38, blood pressure 176/86, and 100% on BiPAP.  GENERAL: The patient is an obese female.  HEENT: Head atraumatic, normocephalic. Pupils  equally round, reactive to light and accommodation. There is no conjunctival pallor. No scleral icterus. Extraocular movements intact. Nasal exam shows no drainage or ulceration.  OROPHARYNX: She has a BiPAP mask on.  NECK: Supple without any thyromegaly. She has a thick the neck, unable to appreciate JVD.  CARDIOVASCULAR: She has a systolic murmur at the left sternal border. There is regular rate and rhythm.  LUNGS: Has bilateral crackles throughout both lung fields with accessory muscle usage. No active wheezing.  ABDOMEN: Soft, nontender, nondistended. Positive bowel sounds x 4.  EXTREMITIES: 2+ edema.  SKIN: No rash.  LYMPHATIC: Lymph nodes: None palpable. VASCULAR: Good DP, PT pulses.  PSYCHIATRIC: Not anxious or depressed.   LABORATORY DATA: Glucose 120, BUN 20, creatinine 1.18, sodium 139, potassium 4.1, chloride 99, CO2 is 22, calcium 9.1. LFTs are normal. Troponin 0.17. WBC is 16 hemoglobin 12.1, platelet count 426. Chest x-ray shows bilateral infiltrates consistent with moderate congestive heart failure. EKG shows left bundle branch block.   ASSESSMENT AND PLAN: The patient is a 74 year old white female [facility] presents with shortness of breath, cough.  1. Acute respiratory failure due to acute congestive heart failure, type unknown. The patient has [facility] but the results are currently not available. We will ask [facility] to see in light of their affiliation with [facility]. We will place the patient on IV Lasix b.i.d. Continue BiPAP. Monitor her respiratory status in the stepdown unit.  2. Coronary artery disease with elevated troponin. We will check serial cardiac enzymes, continue aspirin and Brilinta. Cardiology evaluation for further recommendations.  3. Hypertension. Continue losartan and atenolol as taking at home.  4. Hypothyroidism. Continue Synthroid.  5. Leukocytosis, possible reactive. Follow CBC. Will get a  urinalysis as well. If WBC was still elevated, treat for possible bronchitis.   MISCELLANEOUS: We will do Lovenox for DVT prophylaxis. Please note, the patient is critically ill and at high risk of cardiopulmonary arrest and may require intubation. She will be monitored in the stepdown unit.  CRITICAL CARE TIME SPENT ON THIS PATIENT: 60 minutes.    ____________________________ Lacie ScottsShreyang H. Allena KatzPatel, MD shp:lt D: 11/12/2013 15:28:43 ET T: 11/12/2013 16:06:57 ET JOB#: [id number]  cc: Jenny Lai H. Allena KatzPatel, MD, <Dictator> Charise CarwinSHREYANG H Michille Mcelrath MD ELECTRONICALLY SIGNED 11/17/2013 8:43

## 2014-06-23 NOTE — Consult Note (Signed)
Present Illness Patient is a 74 year old female with history of moderate aortic stenosis, diffuse Coronary artery disease which is been treated percutaneously with multiple stents both in 2011 and recently in April, 2015, history of peripheral vascular disease status post right internal carotid artery stent, history of diabetes mellitus type 2, hypertension, as well as  dyslipidemia.  She was admitted after several days of worsening shortness of breath orthopnea and PND.  She presented to the emergency room with complaints of severe shortness of breath.  Inability to catch her breath or inability to breathe and talk at the same time.  She was placed on BiPAP.  Chest x-ray revealed moderate pulmonary edema.  Electrocardiogram revealed left bundle-branch block.  She has had intermittent chest pain over the last several weeks.  She takes frequent sublingual nitroglycerin with improvement.  Her serum troponin I was mildly elevated at 0.18.  Echocardiogram done at [facility] in April of this year revealed ejection fraction of 50% with moderate left ventricular hypertrophy, grade 1 diastolic dysfunction and moderate aortic stenosis with an aortic valve area 0.7 cm2 with a peak gradient 52 mm Hg and a mean gradient of 28 mm Hg.  Patient is very limited ambulatory ability.  She predominantly rides on a scooter.  She presented to [facility] and April with chest and jaw pain.  She had a non ST elevation myocardial infarction.  She underwent cardiac catheterization which revealed diffuse 3 vessel disease and underwent sh placement of 3 drug-eluting stents in the mid and distal LAD as well  as 3 drug-eluting stents in the left circumflex system.  She was felt to be a poor surgical candidate it due to her in mobility due to peripheral neuropathy and peripheral artery disease.  She was continued on aspirin,  beta-blockers and losartan and was changed from Plavix to tight CAD would lower.   She was not treated with a statin due to muscle pain with this.  The patient had a Xience 2.75 x 18 and days Xience 2.75 x 18 stent and the mid left circumflex.  She has a Xience 2.5 x 15 stent in the mid LAD, she has a Xience 2.75 x 18 followed by at 2.75 x 18 stent in the LAD.  She is being followed by Dr. Bernette Redbird at [facility] regarding her aortic valve.  She also sees  Dr. Chales Abrahams at [facility] regarding her follow-up cardiac care as well as Dr. Tyrone Sage   In [place].  She is currently improved with regard to her oxygen a shin.  She is much less short of breath.  She complains of left upper abdominal pain which she is worse with certain positions.  It is also improved with deep palpation.  She complains of constipation.  The patient previously had a drug-eluting stent to the RCA and 2nd diagonal in 2011.   Physical Exam:  GEN no acute distress, obese   HEENT PERRL   NECK supple  bilateral carotid bruits right greater than left   RESP normal resp effort  no use of accessory muscles  crackles   CARD Regular rate and rhythm  Murmur   Murmur Systolic   Systolic Murmur Out flow   ABD positive tenderness  no liver/spleen enlargement  normal BS   LYMPH negative neck, negative axillae   EXTR positive cyanosis/clubbing, positive edema, plus minus  posterior tibial pulse   SKIN normal to palpation  NEURO cranial nerves intact, motor/sensory function intact   Review of Systems:  Subjective/Chief Complaint 74 year old female complaining of shortness of breath an upper abdominal discomfort.   General: Fatigue  Weakness   Skin: No Complaints   ENT: No Complaints   Eyes: No Complaints   Neck: No Complaints   Respiratory: Short of breath   Cardiovascular: Dyspnea  Edema   Gastrointestinal: Constipation  Left upper abdominal discomfort   Genitourinary: No Complaints   Vascular: No Complaints   Musculoskeletal: No  Complaints   Neurologic: No Complaints   Hematologic: No Complaints   Endocrine: No Complaints   Psychiatric: No Complaints   Review of Systems: All other systems were reviewed and found to be negative   Medications/Allergies Reviewed Medications/Allergies reviewed   Family & Social History:  Family and Social History:  Family History Non-Contributory   Social History negative tobacco   Place of Living Home   EKG:  EKG NSR   Abnormal LBBB    Statins: Unknown  Sulfa drugs: Unknown  Keflex: Unknown   Impression 74 year old female with history of diffuse Coronary artery disease use is status post placement of 3 stents in her LAD ENT stents in the left circumflex approximately 2-3 months ago treated with ticagrelor and aspirin for dual anti-platelet therapy.  She is also on oral nitrates, beta-blocker.  She is not on a statin secondary to intolerance.  She also has a history of the face moderate aortic stenosis with a valve area 0.7 cm2.  Her peak gradient was 52 mm Hg up from 35 mm Hg in 2013. This is being followed by Dr. Romeo Apple at [facility].  She now was admitted with worsening symptoms of shortness of breath and evidence of congestive heart failure on chest x-ray.  Her electrocardiogram shows left bundle-branch block.  She had a borderline left bundle-branch block during her present patient to [facility] recently.  She has a moderate serum troponin elevation of 0.18.  She denies chest pain similar to what she had when presenting with her non ST elevation myocardial infarction to [facility] in April of this year.  She has had frequent chest pains and has been followed by a both primary care here in [place] and Cardiology in [place].  She uses 7 or 8 nitroglycerin sublingually weekly.  The symptoms of shortness of breath or relatively new prompting her admission where she was noted to have congestive heart failure.  She  has left upper quadrant abdominal discomfort and as well. would continue with current medical regimen including beta blockers, oral nitrates anti CAD lower as well as aspirin.  D for statin secondary to intolerance.  Her shortness of breath symptoms may be related to the take a goal or although chest x-ray suggests significant pulmonary edema.  Would continue with BiPAP for now and gentle diuresis following renal function.  Will repeat her echocardiogram to determine if there is any change in her left ventricular function post PCI.  Will also do a left upper quadrant abdominal scan to evaluate the etiology of her abdominal pain.  Will discuss with Dr. Romeo Apple at [facility] when he is available.   Plan 1. Continue to gently diurese 2. Continue with BiPAP 3. continue to rule out for myocardial infarction 4. Continue with beta-blocker, tachy a goal or, aspirin and oral nitrates 5. avoid  statin secondary to intolerance 6. Echocardiogram to evaluate for worsening LV function or aortic valve 7. Left upper  quadrant abdominal ultrasound to evaluate etiology of her pain 8. Will discuss with her primary [facility] when available 9. further recommendations pending course   Electronic Signatures: Dalia HeadingFath, Elison Worrel A (MD)  (Signed 14-Sep-15 07:32)  Authored: General Aspect/Present Illness, History and Physical Exam, Review of System, Family & Social History, EKG , Allergies, Impression/Plan   Last Updated: 14-Sep-15 07:32 by Dalia HeadingFath, Kutter Schnepf A (MD)

## 2014-06-23 NOTE — Discharge Summary (Signed)
PATIENT NAME:  Laurie [name], Laurie [name] MR#:  [id number] DATE OF BIRTH:  June 11, 1940  DATE OF ADMISSION:  11/12/2013 DATE OF DISCHARGE:  11/16/2013  PRIMARY CARE PHYSICIAN: Dr. Hillery AldoSarah Patel.  DISCHARGE DIAGNOSES:  1. Acute respiratory failure due to acute systolic and diastolic congestive heart failure with ejection fraction 45% to 50%.  2. Acute renal failure.  3. Urinary tract infection.  4. Hypertension.  5. Diabetes.  6. Coronary artery disease.   CONDITION: Stable.   CODE STATUS: Full Code.   HOME MEDICATIONS: Please refer to the medication reconciliation list.   DIET: Low-sodium, low-fat, low-cholesterol, ADA diet.   ACTIVITY: As tolerated.   FOLLOW-UP CARE: Follow up with PCP and Dr. Lady GaryFath within 1 to 2 weeks.   REASON FOR ADMISSION: Shortness of breath, acute respiratory failure.   HOSPITAL COURSE: The patient is a 74 year old Caucasian female with multiple medical problems including CAD, diabetes, hypothyroidism, bilateral carotid arterial stenosis, previous CVA, was sent to the ED due to shortness of breath and a dry cough. For a detailed history and physical examination, please refer to the admission note dictated by Dr. Auburn BilberryShreyang Patel.   LABORATORY DATA ON ADMISSION DATE: Showed BUN 20, creatinine 1.18, glucose 120. Electrolytes were normal. Troponin 0.17. WBC 16, hemoglobin 12.1.   Chest x-ray shows bilateral infiltrates consistent with moderate congestive heart failure.   EKG showed a left bundle branch block.   1. The patient was admitted for acute respiratory failure due to acute CHF. After admission, the patient was treated with Lasix 60 mg IV b.i.d. In addition, the patient had an echocardiograph, which showed ejection fraction 45% to 50%. Dr. Lady GaryFath evaluated the patient and suggested to continue Lasix; however, since the patient's creatinine increased to 1.52. Lasix was discontinued to 40 mg daily. The patient's creatinine decreased to 1.42 today.  2. Acute respiratory  failure. The patient was initially treated with BiPAP and admitted to stepdown. The patient was weaned off BiPAP and on oxygen by nasal cannula, 4 liters. The patient was wean off oxygen this am, but oxygen saturation  was down to 85% on physical therapy.  She need home oxygen. 3. CAD with elevated troponin, possibly due to acute CHF. The patient has been treated with aspirin. Dr. Lady GaryFath suggested to discontinue Brilinta and started Plavix. The patient needs to avoid statin secondary to intolerance, according to Dr. Lady GaryFath.  4. Hypertension has been controlled with losartan and atenolol.  5. The patient also has a UTI with leukocytosis, has been treated with Cipro. The patient needs to continue Cipro treatment and have follow-up CBC as outpatient.  6. Diabetes. Blood sugar is controlled. The patient has been treated with sliding scale, Levemir, 30 units at bedtime.   The patient's symptoms have much improved. Her vital signs are stable. According to physical therapy evaluation, the patient needs subacute rehabilitation, but she refused. She wants to go home with home health and physical therapy. The patient is clinically stable and will be discharged to  home with home health and physical therapy today. I discussed the patient's discharge plan with the patient, nurse and case manager.   TIME SPENT: About 42 minutes.    ____________________________ Shaune PollackQing Alfonzia Woolum, MD qc:JT D: 11/16/2013 12:13:13 ET T: 11/16/2013 12:46:18 ET JOB#: [id number]  cc: Shaune PollackQing Ashlee Player, MD, <Dictator> Shaune PollackQING Aleli Navedo MD ELECTRONICALLY SIGNED 11/16/2013 16:16

## 2014-07-01 NOTE — Discharge Summary (Signed)
 PATIENT NAME:  Laurie BowlerKLEEBERG, Leasia L MR#:  [id number] DATE OF BIRTH:  October 23, 1940  DATE OF ADMISSION:  03/15/2014 DATE OF DISCHARGE:  03/16/2013  [facility]  HOSPITAL COURSE: The patient is a 74 year old female with multiple medical problems of hypertension, diabetes mellitus type 2, CAD, peripheral vascular disease, severe aortic stenosis, comes in because of shortness of breath, lower extremity edema, and chest tightness. The patient has a history of coronary artery disease with stents. She had multiple stents in 2011 and recently in April 2015. The patient admitted this morning for shortness of breath and elevated BNP of 10,000. The patient's EKG showed lateral T-wave inversions. The patient's initial troponins were elevated at 0.28 and the CK was 4.7, so she was admitted for non-ST-elevation MI and CHF exacerbation. The patient was taking Lasix at home. Restarted  the Lasix 40 mg IV daily and she is also started on heparin drip. The patient received 325 mg of aspirin in the ER and we continued 81 mg of aspirin and Plavix 75 mg daily. The patient's troponins continued to rise and peak troponin was 1.30. The patient admitted to telemetry. Initially, she refused to [facility], so patient decided to [facility] so we admitted her, this afternoon   patient decided [facility], so I called [facility]  transfer [facility] and [facility] has been [facility]. The patient does have CKD stage III. Her creatinine is slightly worse today and the baseline creatinine is around 1.6. The patient's creatinine is 2.52 and BUN of 78. The patient's chest x-ray this morning showed no edema or consolidation. Heart is enlarged. Other labs showed blood cultures were negative. The patient does have some congestive hepatopathy with alkaline phosphatase 154, ALT 929 and AST of 1440. The patient wanted a Foley catheter insertion, so the patient received a Foley catheter in the ER.  After Lasix 40 mg urine o, She might have voided around 250 mL. The patient's urine output is not documented yet, but looked at the Foley and the Foley is showing around 250 mL of urine output.    DISCHARGE MEDICATIONS: Aspirin 81 mg daily, Plavix 75 mg p.o. daily, Zetia 10 mg daily, furosemide 40 mg IV daily, insulin with sliding scale coverage, Imdur 60 mg daily, levothyroxine 125 mg p.o. in the morning, losartan 50 mg p.o. daily, metoprolol tartrate 25 mg p.o. daily. The patient received vancomycin in the ER because of lower extremity redness, but the patient has chronic stasis dermatitis, but no evidence of infection.   DISCHARGE DIAGNOSES: 1.  Non-ST elevation myocardial infarction.  2.  Acute on chronic diastolic heart failure.  3.  Coronary artery disease with multiple stents.  4.  Severe aortic stenosis.  [facility].  Chronic kidney disease stage III with acute worsening.  6.  Diabetes mellitus type 2 with diabetic nephropathy.  7.  Hypothyroidism.   TIME SPENT ON THIS STAT DISCHARGE SUMMARY: More than 30 minutes.   [facility] when the arrangements are made.    ____________________________ Katha HammingSnehalatha Reianna Batdorf, MD sk:at D: 03/16/2014 15:24:04 ET T: 03/16/2014 15:38:51 ET JOB#: [id number]  cc: Katha HammingSnehalatha Dalaya Suppa, MD, <Dictator> Katha HammingSNEHALATHA Aniaya Bacha MD ELECTRONICALLY SIGNED  13:13

## 2014-07-01 NOTE — H&P (Signed)
PATIENT NAME:  Laurie [name], Laurie [name] MR#:  [id number] DATE OF BIRTH:  1941-02-07  DATE OF ADMISSION:  03/15/2014  REFERRING PHYSICIAN: Eartha Inch. York Cerise, MD  PRIMARY CARE PHYSICIAN: Hillery Aldo, MD  CARDIOLOGY: At [facility]; however, she previously followed with Dr. Lady Gary with [facility].   CHIEF COMPLAINT: Edema.   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 Describes her lower extremity edema which is chronic; however, worsening over the last 3 to 4 days in total with some weeping edema as well. With the urgings of her family, decided to present to the hospital for further workup and evaluation. She notes having shortness of breath with dyspnea on exertion and edema as stated above as well as orthopnea. Also states that she has chest pain "all the time," retrosternal in location, pressure in quality, 7 out of 10 in intensity, radiation to both arms, no worsening or relieving factors. Of note, she was supposed to undergo aortic valve replacement as well as potential CABG at [facility] on 02/08/2014; however, she did not have this procedure performed due to personal issues.  REVIEW OF SYSTEMS:  CONSTITUTIONAL: Denies fevers, chills. Positive for fatigue, weakness.  EYES: Denies blurred vision, double vision, eye pain. ENT: Denies tinnitus, ear pain, hearing loss.  RESPIRATORY: Denies cough; however, positive for shortness of breath.  CARDIOVASCULAR: Positive for chest pain, orthopnea, edema as stated above.  GASTROINTESTINAL: Denies nausea, vomiting, diarrhea, abdominal pain.  GENITOURINARY: Denies dysuria, hematuria.  ENDOCRINE: Denies nocturia or thyroid problems. HEMATOLOGY AND LYMPHATIC: Denies easy bruising and bleeding.  SKIN: Denies rashes. Positive for ulceration over the left great toe.   MUSCULOSKELETAL: Denies pain in neck, back, shoulder, knees, hips, or arthritic symptoms.  NEUROLOGIC: Denies paralysis, paresthesias.  PSYCHIATRIC: Denies anxiety or depressive symptoms.  Otherwise, full review of systems performed by me is negative.   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   SOCIAL HISTORY: No alcohol, tobacco, or drug usage.  FAMILY HISTORY: Father had coronary artery disease.   ALLERGIES: KEFLEX, STATINS, AND SULFA DRUGS.   HOME MEDICATIONS: Include losartan 50 mg p.o. q. daily, Imdur 60 mg p.o. q. daily, Zetia 10 mg p.o. q. daily, Plavix 75 mg p.o. q. daily, metoprolol 25 mg p.o. q. daily, Lasix 40 mg p.o. q. daily, magnesium oxide 400 mg p.o. q. daily, potassium 20 mEq p.o. q. daily, levothyroxine 125 mcg p.o. q. daily.   PHYSICAL EXAMINATION:  VITAL SIGNS: Temperature of 97.5, heart rate 93, respirations 16, blood pressure 125/78, saturating 90% on supplemental oxygen. Weight 112.9 kg, BMI 37.9.  GENERAL: Chronically ill-appearing Caucasian female currently in minimal distress given shortness of breath, chest pain.  HEAD: Normocephalic, atraumatic.  EYES: Pupils equal, round, reactive to light. Extraocular muscles intact. No scleral icterus.  MOUTH: Moist mucosal membranes. Dentition intact. No abscess noted. EARS, NOSE, THROAT: Clear without exudates. No external lesions.  NECK: Supple. No thyromegaly. No nodules. No JVD.  PULMONARY: Diminished breath sounds secondary to poor respiratory effort; however, no wheezes, rales, rhonchi.  CHEST: Nontender to palpation.  CARDIOVASCULAR: S1, S2, regular rate and rhythm with a 3/6 systolic ejection murmur. She has 2 to 3+ edema to the knees bilaterally. Pedal pulses are diminished.  GASTROINTESTINAL: Soft, nontender, nondistended. No  masses. Positive bowel  sounds. No hepatosplenomegaly.  MUSCULOSKELETAL: No swelling, clubbing, or cynosis. Range of motion full in all extremities.  NEUROLOGIC: Cranial nerves II through XII intact. No gross focal neurological deficits. Sensation intact. Reflexes intact.  SKIN: There is approximately a 2 x 2 cm ulceration over the left great toe with some areas of necrosis and surrounding erythema. Otherwise, no further lesions, rashes, cyanosis. Skin warm, dry. Turgor intact.  PSYCHIATRIC: Mood and affect within normal limits. Patient awake, alert, oriented x 3. Insight and judgment are intact.   LABORATORY DATA: Chest x-ray performed which reveals cardiomegaly; however, no acute cardiopulmonary process. X-ray of the left foot: Diffuse soft tissue thickening. No evidence of osteomyelitis. Remainder of laboratory: EKG performed reveals lateral T inversion. Sodium of 128, potassium 4.8, chloride of 94, bicarbonate of 21, BUN 75, creatinine 2.59, glucose of 146. BNP 10,528. Troponin 0.26. WBC of 12.7, hemoglobin 10.9, platelets of 423,000. INR of 1.6. Had an echocardiogram performed back in 2015 revealing ejection fraction of 45%, E to A ratio of 2.06, left atrial dilatation of 4.4 cm, severe aortic stenosis, mild mitral regurgitation, and mild tricuspid regurgitation.   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  1.  Acute on chronic congestive heart failure: Culmination of systolic, diastolic, and valvular dysfunction given ejection fraction of 45%, E to A ratio of 2.06, severe aortic stenosis. Consult cardiology. She previously followed with Dr. Lady GaryFath; however, [facility]. Diurese with Lasix if blood pressure tolerates. Aspirin and statin therapy. Follow urine output and renal function. DuoNeb treatments q. 4 hours. Supplemental oxygen as required. It was  actually discussed with this patient by the Emergency Room staff and myself transfer [facility], where she has a cardiologist as well as cardiothoracic surgeon who she follows with. Despite that, she wishes to stay at this hospital.  2.  Non-ST-elevation myocardial infarction: Given significant coronary artery disease and elevated troponin with chest pain, initiate aspirin therapy as well as heparin drip. Place on telemetry. Trend cardiac enzymes x 3.  3.  Acute kidney injury complicated by poor cardiac output: Diurese with Lasix. Avoid further nephrotoxic agents. Follow renal function and urine output.  4.  Type 2 diabetes complicated by neuropathy, retinopathy: Insulin sliding scale with q. 6 hour Accu-Cheks. Hold any p.o. agents.  5.  Hypothyroidism: Continue with Synthroid.  6.  Venous thromboembolism prophylaxis with heparin drip.  CODE STATUS: The patient is full code.   TIME SPENT: 55 minutes.    ____________________________ Cletis Athensavid K. Hower, MD dkh:ST D: 03/15/2014 23:45:11 ET T: 03/16/2014 01:17:49 ET JOB#: [id number]  cc: Cletis Athensavid K. Hower, MD, <Dictator> DAVID Synetta ShadowK HOWER MD ELECTRONICALLY SIGNED 03/17/2014 4:00

## 2014-07-01 NOTE — Consult Note (Signed)
PATIENT NAME:  Laurie [name], Laurie [name] MR#:  [id number] DATE OF BIRTH:  1940/08/30  DATE OF CONSULTATION:  03/16/2014  REFERRING PHYSICIAN:  Cletis Athens. Hower, MD  CONSULTING PHYSICIAN:  Lamar Blinks, MD  REASON FOR CONSULTATION: Non-ST elevation myocardial infarction with acute on chronic systolic dysfunction congestive heart failure.   CHIEF COMPLAINT: "I have shortness of breath."   HISTORY OF PRESENT ILLNESS: This is a 74 year old female with known cardiovascular disease status post previous coronary artery stent with severe aortic valve stenosis, chronic kidney disease stage IV, acute on chronic systolic dysfunction congestive heart failure with pulmonary edema and severe lower extremity edema, and diabetes, worsening at this time and currently needing further intervention, including scheduling for surgery for aortic valve stenosis. The patient has had worsening symptoms at this time and significant lower extremity edema. The patient has had intravenous Lasix, which has helped her pulmonary edema, chest x-ray, as well as hypoxia, but only a slight improvement in her weeping lower extremities. The patient does have an elevated troponin of 1.3, possibly consistent with coronary artery disease and myccardial infarction with hypoxia. There has been no evidence of significant EKG changes.   REVIEW OF SYSTEMS: The remainder review of systems negative for vision change, ringing in the ears, hearing loss, cough, congestion, heartburn, nausea, vomiting, diarrhea, bloody stools, stomach pain, extremity pain, leg weakness, cramping of the buttocks, known blood clots, headaches, blackouts, dizzy spells, nosebleeds, congestion, trouble swallowing, frequent urination, urination at night, muscle weakness, numbness, anxiety, depression, skin lesions, and skin rashes.   PAST MEDICAL HISTORY: 1. Chronic systolic dysfunction heart failure.  2. Chronic kidney disease stage IV.  3. Severe aortic stenosis.  4. Diabetes.   5. Coronary artery disease, status post stenting.   FAMILY HISTORY: Multiple family members with cardiovascular disease of early onset.   SOCIAL HISTORY: Currently denies alcohol or tobacco use.   ALLERGIES: As listed.   MEDICATIONS: As listed.   PHYSICAL EXAMINATION: VITAL SIGNS: Blood pressure is 105/68 bilaterally. Heart rate is 70, upright, reclining, and slightly irregular.  GENERAL: She is a well-appearing elderly female in no acute distress.  HEENT: No icterus, thyromegaly, ulcers, hemorrhage, or xanthelasma.  CARDIOVASCULAR: Regular rate and rhythm with normal S1, soft S2, with a 3/6 right upper sternal border murmur radiating throughout and into the carotids. PMI is diffuse. Carotid upstroke normal with murmur radiation. Jugular venous pressure is elevated.  LUNGS: Have bibasilar crackles with decreased breath sounds.  ABDOMEN: Soft, nontender, without hepatosplenomegaly or masses. Abdominal aorta cannot be felt or heard.  EXTREMITIES: 2+ radial. No femoral or dorsal pedal pulses, with 2+ lower extremity edema, with some weeping.  NEUROLOGIC: She is oriented to time, place, and person, with normal mood and affect.   ASSESSMENT: A 74 year old female with non-ST elevation myocardial infarction, acute on chronic systolic dysfunction congestive heart failure, chronic kidney disease stage IV, severe aortic valve stenosis, diabetes, hypertension, hyperlipidemia, and coronary artery disease.   RECOMMENDATIONS: 1. Continue intravenous Lasix, watching for worsening chronic kidney disease to improve congestive heart failure.  2. No further diagnostic testing due to mild LV systolic dysfunction and severe aortic stenosis, previously assessed.  3. Transfer for further intervention, including aortic valve surgery,  4. Continue heparin for further risk reduction of cardiovascular event.  5. Further diagnostic testing and treatment options after above.    ____________________________ Lamar Blinks, MD [email or web address] D: 03/17/2014 08:55:54 ET T: 03/17/2014 11:24:31 ET JOB#: [id number]  cc: Lamar Blinks, MD, <Dictator> Lamar Blinks  MD ELECTRONICALLY SIGNED 03/19/2014 13:26

## 2015-07-29 IMAGING — US ABDOMEN ULTRASOUND
1 series · 14 of 25 positions shown · non-contrast
Comparison: None.

CLINICAL DATA: Left upper quadrant abdominal pain.

EXAM:
ULTRASOUND ABDOMEN COMPLETE

[Series 1: abdomen ultrasound · 0.24mm/px · 14 of 87 slices shown]
[im 1/87]
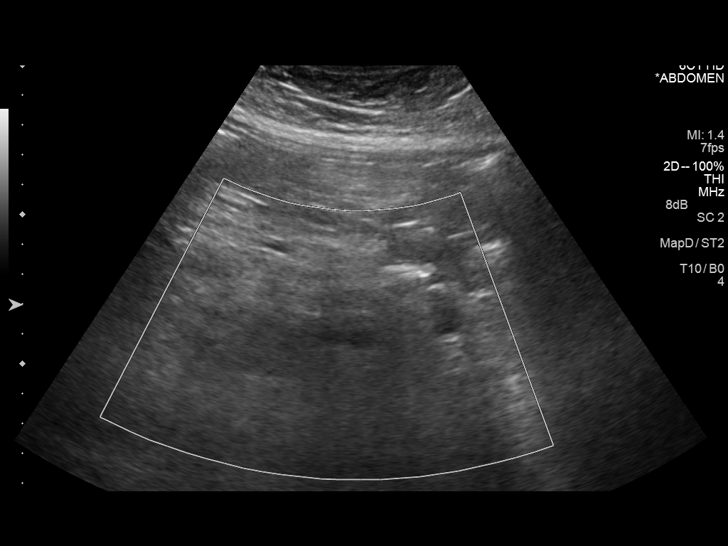
[im 8/87]
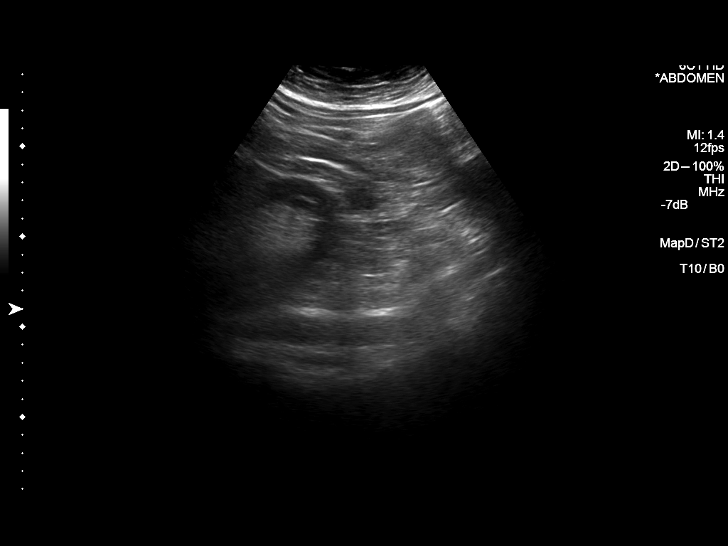
[im 15/87]
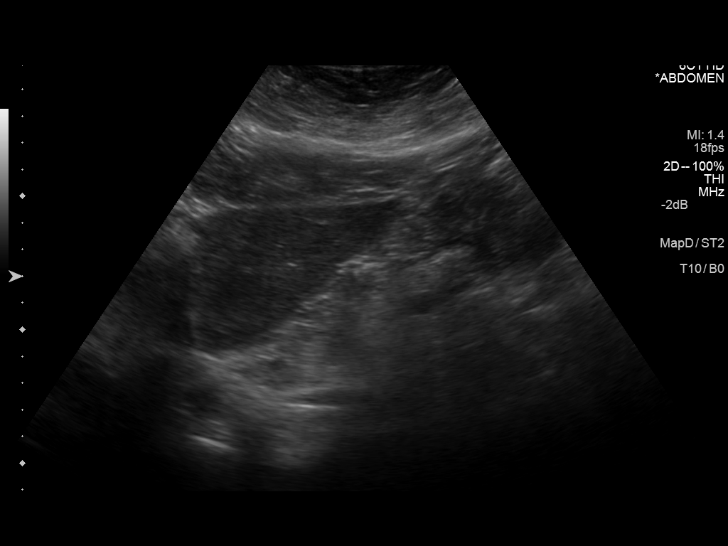
[im 22/87]
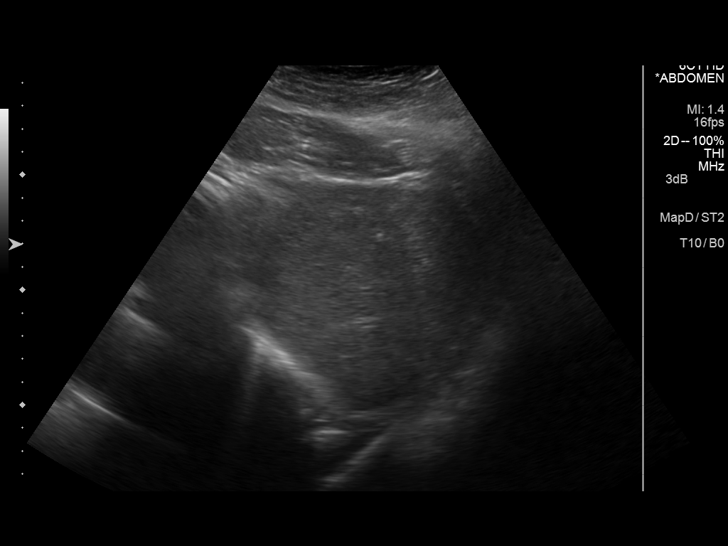
[im 29/87]
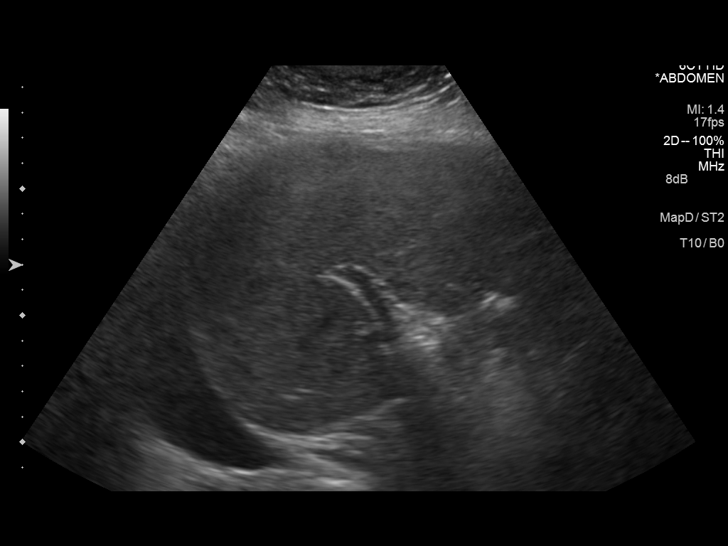
[im 33/87]
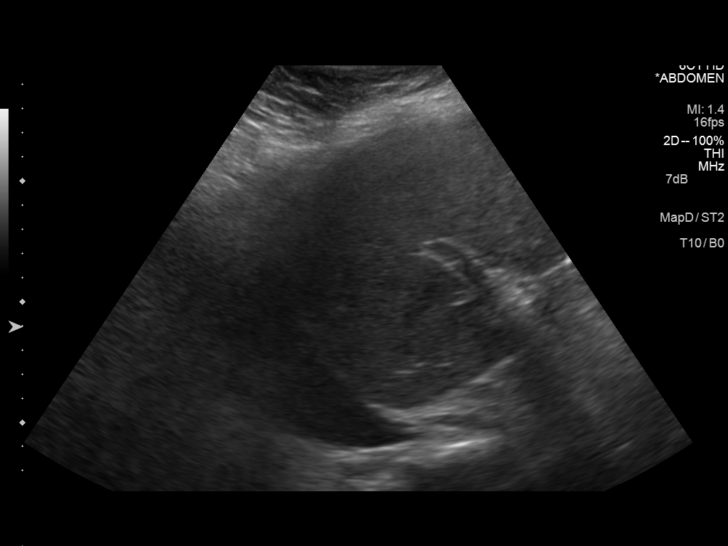
[im 40/87]
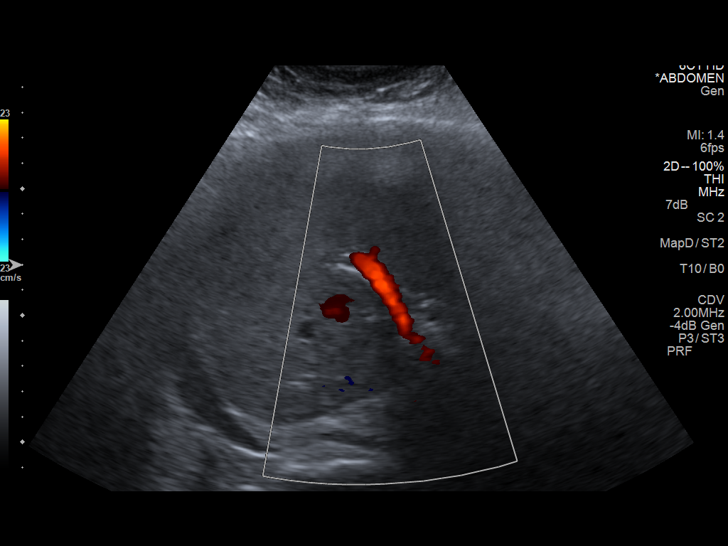
[im 47/87]
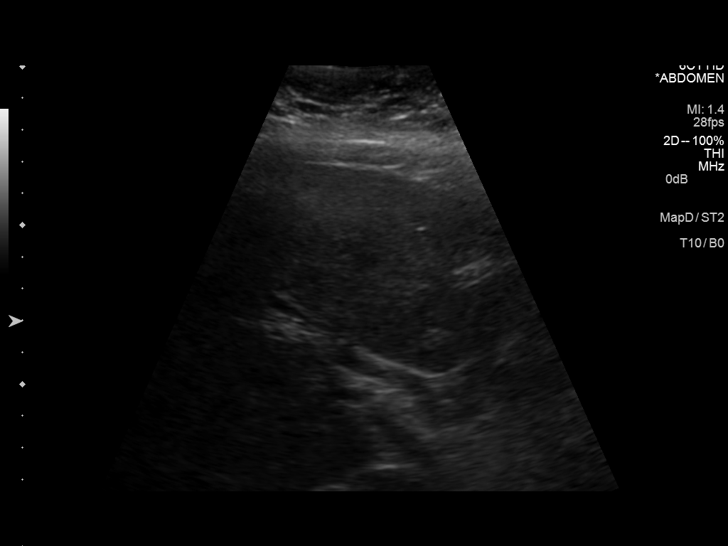
[im 54/87]
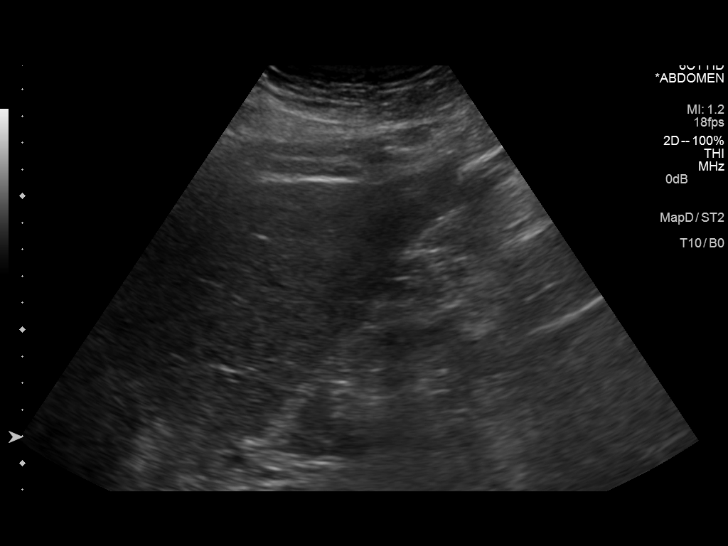
[im 58/87]
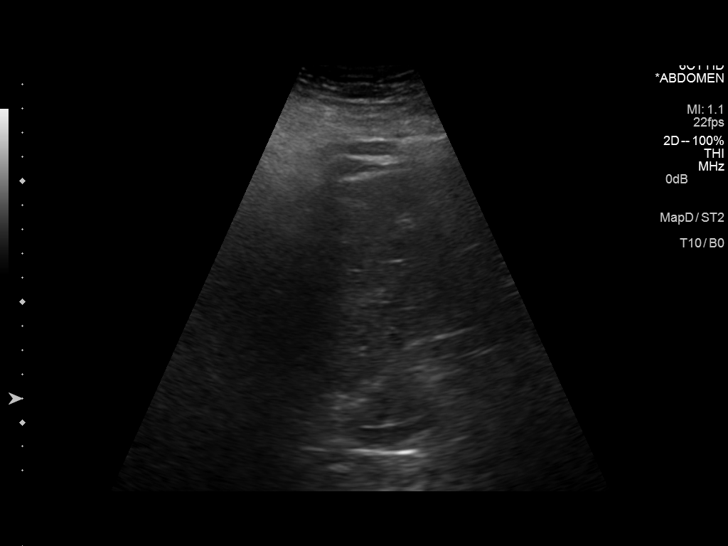
[im 65/87]
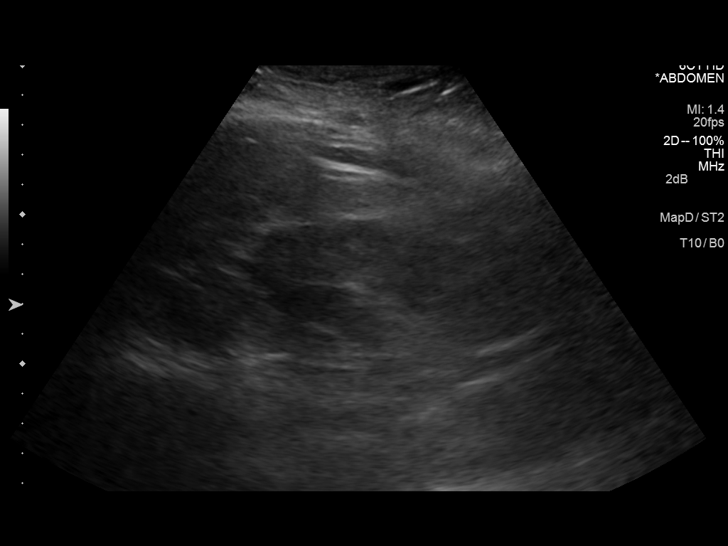
[im 72/87]
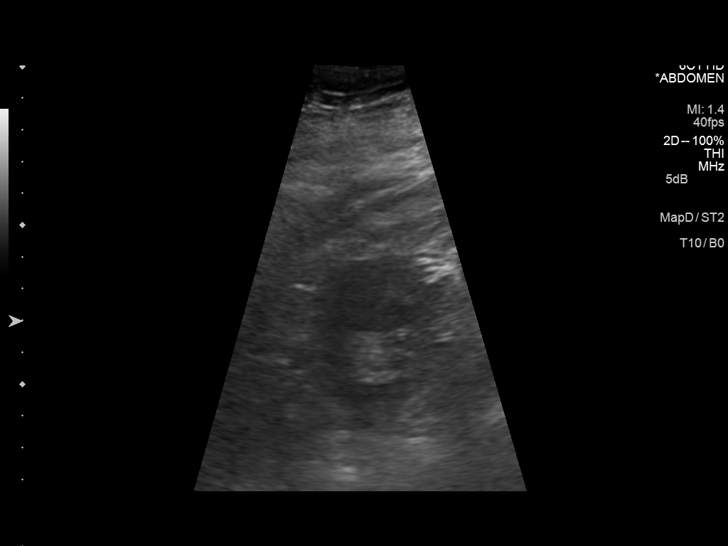
[im 79/87]
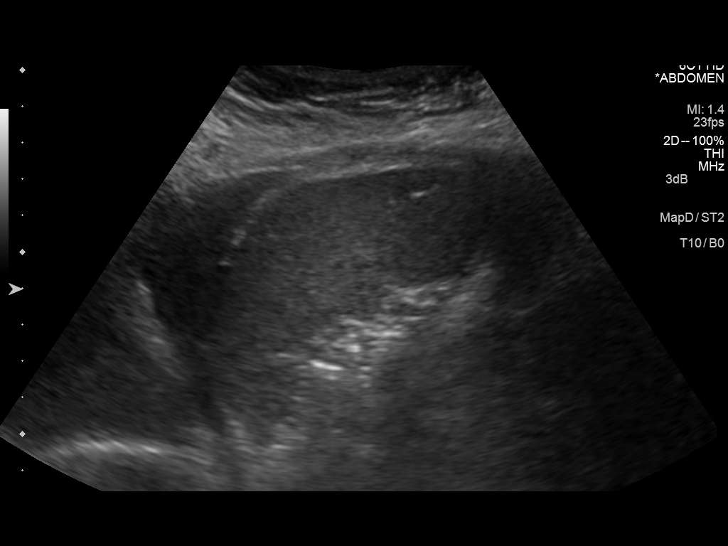
[im 87/87]
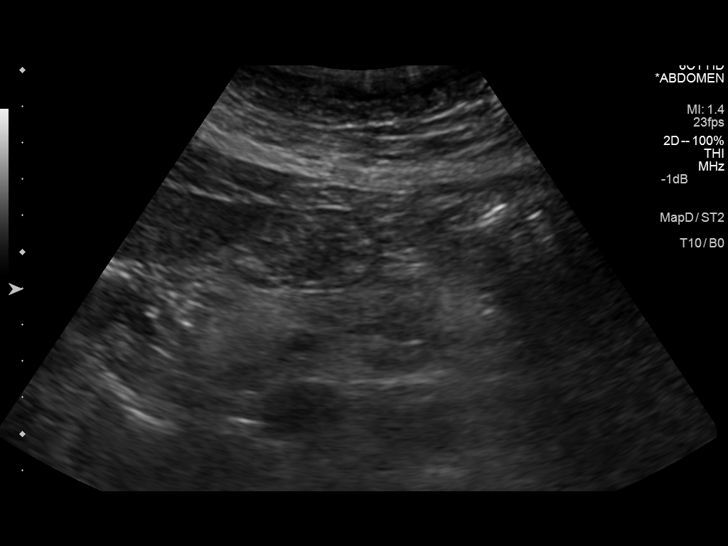

[14 of 25 positions shown; findings below may reference images not displayed]

FINDINGS: Gallbladder:

Status post cholecystectomy.

Common bile duct:

Diameter: Measures 7.4 mm which is within normal limits for a post
cholecystectomy patient.

Liver:

No focal lesion identified. Within normal limits in parenchymal
echogenicity.

IVC:

No abnormality visualized.

Pancreas:

Visualized portion appears normal.

Spleen:

Size and appearance within normal limits.

Right Kidney:

Length: 9.1 cm. Echogenicity within normal limits. No mass or
hydronephrosis visualized.

Left Kidney:

Length: 11.1 cm. Echogenicity within normal limits. No
hydronephrosis visualized. Complex appearing hypoechoic abnormality
measuring 4.0 x 3.1 x 2.7 cm is seen arising from midpole of left
kidney.

Abdominal aorta:

No aneurysm visualized.

Other findings:

Bilateral pleural effusions are noted.
IMPRESSION: Complex hypoechoic abnormality measuring 4 cm seen arising from
midpole of left kidney. Further evaluation with CT or MRI is
recommended rule out potential neoplasm.

Status post cholecystectomy.

Bilateral pleural effusions are noted.
